# Patient Record
Sex: Female | Born: 1945 | Race: White | Hispanic: No | Smoking: Never smoker
Health system: Southern US, Community
[De-identification: ages and names within clinical notes are randomized; demographics above are authoritative.]

## PROBLEM LIST (undated history)

## (undated) DIAGNOSIS — E119 Type 2 diabetes mellitus without complications: Secondary | ICD-10-CM

## (undated) DIAGNOSIS — I2510 Atherosclerotic heart disease of native coronary artery without angina pectoris: Secondary | ICD-10-CM

## (undated) DIAGNOSIS — F419 Anxiety disorder, unspecified: Secondary | ICD-10-CM

## (undated) DIAGNOSIS — I739 Peripheral vascular disease, unspecified: Secondary | ICD-10-CM

## (undated) DIAGNOSIS — I5181 Takotsubo syndrome: Secondary | ICD-10-CM

## (undated) DIAGNOSIS — F329 Major depressive disorder, single episode, unspecified: Secondary | ICD-10-CM

## (undated) DIAGNOSIS — F32A Depression, unspecified: Secondary | ICD-10-CM

## (undated) DIAGNOSIS — I10 Essential (primary) hypertension: Secondary | ICD-10-CM

## (undated) HISTORY — DX: Essential (primary) hypertension: I10

## (undated) HISTORY — PX: CARDIAC CATHETERIZATION: SHX172

## (undated) HISTORY — DX: Major depressive disorder, single episode, unspecified: F32.9

## (undated) HISTORY — DX: Type 2 diabetes mellitus without complications: E11.9

## (undated) HISTORY — DX: Anxiety disorder, unspecified: F41.9

## (undated) HISTORY — DX: Depression, unspecified: F32.A

## (undated) HISTORY — DX: Takotsubo syndrome: I51.81

## (undated) HISTORY — PX: ABDOMINAL HYSTERECTOMY: SHX81

## (undated) HISTORY — DX: Atherosclerotic heart disease of native coronary artery without angina pectoris: I25.10

## (undated) HISTORY — PX: BREAST BIOPSY: SHX20

## (undated) HISTORY — PX: TUMOR REMOVAL: SHX12

## (undated) HISTORY — DX: Peripheral vascular disease, unspecified: I73.9

---

## 2011-09-18 DIAGNOSIS — E669 Obesity, unspecified: Secondary | ICD-10-CM | POA: Diagnosis not present

## 2011-09-18 DIAGNOSIS — F329 Major depressive disorder, single episode, unspecified: Secondary | ICD-10-CM | POA: Diagnosis not present

## 2011-09-18 DIAGNOSIS — I10 Essential (primary) hypertension: Secondary | ICD-10-CM | POA: Diagnosis not present

## 2011-09-18 DIAGNOSIS — E785 Hyperlipidemia, unspecified: Secondary | ICD-10-CM | POA: Diagnosis not present

## 2011-10-26 DIAGNOSIS — Z23 Encounter for immunization: Secondary | ICD-10-CM | POA: Diagnosis not present

## 2011-10-26 DIAGNOSIS — Z0000 Encounter for general adult medical examination without abnormal findings: Secondary | ICD-10-CM | POA: Diagnosis not present

## 2011-10-26 DIAGNOSIS — E785 Hyperlipidemia, unspecified: Secondary | ICD-10-CM | POA: Diagnosis not present

## 2011-10-26 DIAGNOSIS — E119 Type 2 diabetes mellitus without complications: Secondary | ICD-10-CM | POA: Diagnosis not present

## 2011-11-13 DIAGNOSIS — Z1231 Encounter for screening mammogram for malignant neoplasm of breast: Secondary | ICD-10-CM | POA: Diagnosis not present

## 2011-11-29 DIAGNOSIS — Z1211 Encounter for screening for malignant neoplasm of colon: Secondary | ICD-10-CM | POA: Diagnosis not present

## 2011-11-29 DIAGNOSIS — K5730 Diverticulosis of large intestine without perforation or abscess without bleeding: Secondary | ICD-10-CM | POA: Diagnosis not present

## 2012-02-27 DIAGNOSIS — I10 Essential (primary) hypertension: Secondary | ICD-10-CM | POA: Diagnosis not present

## 2012-02-27 DIAGNOSIS — E119 Type 2 diabetes mellitus without complications: Secondary | ICD-10-CM | POA: Diagnosis not present

## 2012-02-27 DIAGNOSIS — Z6834 Body mass index (BMI) 34.0-34.9, adult: Secondary | ICD-10-CM | POA: Diagnosis not present

## 2012-02-27 DIAGNOSIS — E785 Hyperlipidemia, unspecified: Secondary | ICD-10-CM | POA: Diagnosis not present

## 2012-03-01 DIAGNOSIS — Z13820 Encounter for screening for osteoporosis: Secondary | ICD-10-CM | POA: Diagnosis not present

## 2012-05-20 DIAGNOSIS — E785 Hyperlipidemia, unspecified: Secondary | ICD-10-CM | POA: Diagnosis not present

## 2012-05-20 DIAGNOSIS — Z79899 Other long term (current) drug therapy: Secondary | ICD-10-CM | POA: Diagnosis not present

## 2012-05-20 DIAGNOSIS — I10 Essential (primary) hypertension: Secondary | ICD-10-CM | POA: Diagnosis not present

## 2012-05-20 DIAGNOSIS — F329 Major depressive disorder, single episode, unspecified: Secondary | ICD-10-CM | POA: Diagnosis not present

## 2012-06-10 DIAGNOSIS — H40059 Ocular hypertension, unspecified eye: Secondary | ICD-10-CM | POA: Diagnosis not present

## 2012-07-02 DIAGNOSIS — I10 Essential (primary) hypertension: Secondary | ICD-10-CM | POA: Diagnosis not present

## 2012-07-02 DIAGNOSIS — Z6834 Body mass index (BMI) 34.0-34.9, adult: Secondary | ICD-10-CM | POA: Diagnosis not present

## 2012-07-02 DIAGNOSIS — E119 Type 2 diabetes mellitus without complications: Secondary | ICD-10-CM | POA: Diagnosis not present

## 2012-07-02 DIAGNOSIS — E785 Hyperlipidemia, unspecified: Secondary | ICD-10-CM | POA: Diagnosis not present

## 2012-10-28 DIAGNOSIS — Z6833 Body mass index (BMI) 33.0-33.9, adult: Secondary | ICD-10-CM | POA: Diagnosis not present

## 2012-10-28 DIAGNOSIS — J209 Acute bronchitis, unspecified: Secondary | ICD-10-CM | POA: Diagnosis not present

## 2012-11-06 DIAGNOSIS — Z6834 Body mass index (BMI) 34.0-34.9, adult: Secondary | ICD-10-CM | POA: Diagnosis not present

## 2012-11-06 DIAGNOSIS — E119 Type 2 diabetes mellitus without complications: Secondary | ICD-10-CM | POA: Diagnosis not present

## 2012-11-06 DIAGNOSIS — Z0000 Encounter for general adult medical examination without abnormal findings: Secondary | ICD-10-CM | POA: Diagnosis not present

## 2012-12-09 DIAGNOSIS — E785 Hyperlipidemia, unspecified: Secondary | ICD-10-CM | POA: Diagnosis not present

## 2012-12-09 DIAGNOSIS — G479 Sleep disorder, unspecified: Secondary | ICD-10-CM | POA: Diagnosis not present

## 2012-12-09 DIAGNOSIS — I10 Essential (primary) hypertension: Secondary | ICD-10-CM | POA: Diagnosis not present

## 2012-12-30 DIAGNOSIS — R011 Cardiac murmur, unspecified: Secondary | ICD-10-CM | POA: Diagnosis not present

## 2013-03-07 DIAGNOSIS — I10 Essential (primary) hypertension: Secondary | ICD-10-CM | POA: Diagnosis not present

## 2013-03-07 DIAGNOSIS — Z6834 Body mass index (BMI) 34.0-34.9, adult: Secondary | ICD-10-CM | POA: Diagnosis not present

## 2013-03-07 DIAGNOSIS — E785 Hyperlipidemia, unspecified: Secondary | ICD-10-CM | POA: Diagnosis not present

## 2013-03-07 DIAGNOSIS — R233 Spontaneous ecchymoses: Secondary | ICD-10-CM | POA: Diagnosis not present

## 2013-03-07 DIAGNOSIS — E119 Type 2 diabetes mellitus without complications: Secondary | ICD-10-CM | POA: Diagnosis not present

## 2013-03-18 DIAGNOSIS — M719 Bursopathy, unspecified: Secondary | ICD-10-CM | POA: Diagnosis not present

## 2013-03-24 DIAGNOSIS — E7800 Pure hypercholesterolemia, unspecified: Secondary | ICD-10-CM | POA: Insufficient documentation

## 2013-03-24 DIAGNOSIS — I10 Essential (primary) hypertension: Secondary | ICD-10-CM | POA: Insufficient documentation

## 2013-03-24 DIAGNOSIS — D492 Neoplasm of unspecified behavior of bone, soft tissue, and skin: Secondary | ICD-10-CM | POA: Diagnosis not present

## 2013-03-24 DIAGNOSIS — E119 Type 2 diabetes mellitus without complications: Secondary | ICD-10-CM | POA: Insufficient documentation

## 2013-03-24 HISTORY — DX: Essential (primary) hypertension: I10

## 2013-03-24 HISTORY — DX: Type 2 diabetes mellitus without complications: E11.9

## 2013-03-24 HISTORY — DX: Pure hypercholesterolemia, unspecified: E78.00

## 2013-04-09 DIAGNOSIS — M899 Disorder of bone, unspecified: Secondary | ICD-10-CM | POA: Diagnosis not present

## 2013-04-09 DIAGNOSIS — D492 Neoplasm of unspecified behavior of bone, soft tissue, and skin: Secondary | ICD-10-CM | POA: Diagnosis not present

## 2013-04-09 DIAGNOSIS — M949 Disorder of cartilage, unspecified: Secondary | ICD-10-CM | POA: Diagnosis not present

## 2013-04-17 DIAGNOSIS — I10 Essential (primary) hypertension: Secondary | ICD-10-CM | POA: Diagnosis not present

## 2013-04-17 DIAGNOSIS — E119 Type 2 diabetes mellitus without complications: Secondary | ICD-10-CM | POA: Diagnosis not present

## 2013-04-17 DIAGNOSIS — D492 Neoplasm of unspecified behavior of bone, soft tissue, and skin: Secondary | ICD-10-CM | POA: Diagnosis not present

## 2013-04-17 DIAGNOSIS — M948X9 Other specified disorders of cartilage, unspecified sites: Secondary | ICD-10-CM | POA: Diagnosis not present

## 2013-04-17 DIAGNOSIS — D1610 Benign neoplasm of short bones of unspecified upper limb: Secondary | ICD-10-CM | POA: Diagnosis not present

## 2013-04-29 DIAGNOSIS — M25519 Pain in unspecified shoulder: Secondary | ICD-10-CM | POA: Diagnosis not present

## 2013-04-29 DIAGNOSIS — M67919 Unspecified disorder of synovium and tendon, unspecified shoulder: Secondary | ICD-10-CM | POA: Diagnosis not present

## 2013-05-06 DIAGNOSIS — D492 Neoplasm of unspecified behavior of bone, soft tissue, and skin: Secondary | ICD-10-CM | POA: Diagnosis not present

## 2013-05-14 DIAGNOSIS — D492 Neoplasm of unspecified behavior of bone, soft tissue, and skin: Secondary | ICD-10-CM | POA: Diagnosis not present

## 2013-05-16 DIAGNOSIS — D492 Neoplasm of unspecified behavior of bone, soft tissue, and skin: Secondary | ICD-10-CM | POA: Diagnosis not present

## 2013-05-19 DIAGNOSIS — D492 Neoplasm of unspecified behavior of bone, soft tissue, and skin: Secondary | ICD-10-CM | POA: Diagnosis not present

## 2013-05-22 DIAGNOSIS — D492 Neoplasm of unspecified behavior of bone, soft tissue, and skin: Secondary | ICD-10-CM | POA: Diagnosis not present

## 2013-05-26 DIAGNOSIS — Z23 Encounter for immunization: Secondary | ICD-10-CM | POA: Diagnosis not present

## 2013-05-26 DIAGNOSIS — D492 Neoplasm of unspecified behavior of bone, soft tissue, and skin: Secondary | ICD-10-CM | POA: Diagnosis not present

## 2013-05-29 DIAGNOSIS — D492 Neoplasm of unspecified behavior of bone, soft tissue, and skin: Secondary | ICD-10-CM | POA: Diagnosis not present

## 2013-06-03 DIAGNOSIS — G5601 Carpal tunnel syndrome, right upper limb: Secondary | ICD-10-CM | POA: Insufficient documentation

## 2013-06-03 DIAGNOSIS — M898X9 Other specified disorders of bone, unspecified site: Secondary | ICD-10-CM

## 2013-06-03 DIAGNOSIS — M19049 Primary osteoarthritis, unspecified hand: Secondary | ICD-10-CM | POA: Diagnosis not present

## 2013-06-03 DIAGNOSIS — G5600 Carpal tunnel syndrome, unspecified upper limb: Secondary | ICD-10-CM | POA: Diagnosis not present

## 2013-06-03 DIAGNOSIS — D492 Neoplasm of unspecified behavior of bone, soft tissue, and skin: Secondary | ICD-10-CM | POA: Diagnosis not present

## 2013-06-03 DIAGNOSIS — M8560 Other cyst of bone, unspecified site: Secondary | ICD-10-CM | POA: Diagnosis not present

## 2013-06-03 HISTORY — DX: Other specified disorders of bone, unspecified site: M89.8X9

## 2013-06-03 HISTORY — DX: Carpal tunnel syndrome, right upper limb: G56.01

## 2013-06-04 DIAGNOSIS — D492 Neoplasm of unspecified behavior of bone, soft tissue, and skin: Secondary | ICD-10-CM | POA: Diagnosis not present

## 2013-06-06 DIAGNOSIS — D492 Neoplasm of unspecified behavior of bone, soft tissue, and skin: Secondary | ICD-10-CM | POA: Diagnosis not present

## 2013-06-09 DIAGNOSIS — G5600 Carpal tunnel syndrome, unspecified upper limb: Secondary | ICD-10-CM | POA: Diagnosis not present

## 2013-06-10 DIAGNOSIS — D492 Neoplasm of unspecified behavior of bone, soft tissue, and skin: Secondary | ICD-10-CM | POA: Diagnosis not present

## 2013-06-11 DIAGNOSIS — H2589 Other age-related cataract: Secondary | ICD-10-CM | POA: Diagnosis not present

## 2013-06-11 DIAGNOSIS — H524 Presbyopia: Secondary | ICD-10-CM | POA: Diagnosis not present

## 2013-06-12 DIAGNOSIS — D492 Neoplasm of unspecified behavior of bone, soft tissue, and skin: Secondary | ICD-10-CM | POA: Diagnosis not present

## 2013-06-17 DIAGNOSIS — M7989 Other specified soft tissue disorders: Secondary | ICD-10-CM | POA: Diagnosis not present

## 2013-06-17 DIAGNOSIS — D492 Neoplasm of unspecified behavior of bone, soft tissue, and skin: Secondary | ICD-10-CM | POA: Diagnosis not present

## 2013-06-17 DIAGNOSIS — M79609 Pain in unspecified limb: Secondary | ICD-10-CM | POA: Diagnosis not present

## 2013-06-19 DIAGNOSIS — D492 Neoplasm of unspecified behavior of bone, soft tissue, and skin: Secondary | ICD-10-CM | POA: Diagnosis not present

## 2013-06-24 DIAGNOSIS — M79609 Pain in unspecified limb: Secondary | ICD-10-CM | POA: Diagnosis not present

## 2013-06-24 DIAGNOSIS — D492 Neoplasm of unspecified behavior of bone, soft tissue, and skin: Secondary | ICD-10-CM | POA: Diagnosis not present

## 2013-06-24 DIAGNOSIS — M7989 Other specified soft tissue disorders: Secondary | ICD-10-CM | POA: Diagnosis not present

## 2013-06-26 DIAGNOSIS — M7989 Other specified soft tissue disorders: Secondary | ICD-10-CM | POA: Diagnosis not present

## 2013-06-26 DIAGNOSIS — M79609 Pain in unspecified limb: Secondary | ICD-10-CM | POA: Diagnosis not present

## 2013-06-26 DIAGNOSIS — R209 Unspecified disturbances of skin sensation: Secondary | ICD-10-CM | POA: Diagnosis not present

## 2013-06-26 DIAGNOSIS — D492 Neoplasm of unspecified behavior of bone, soft tissue, and skin: Secondary | ICD-10-CM | POA: Diagnosis not present

## 2013-06-30 DIAGNOSIS — G5600 Carpal tunnel syndrome, unspecified upper limb: Secondary | ICD-10-CM | POA: Diagnosis not present

## 2013-07-01 DIAGNOSIS — M7989 Other specified soft tissue disorders: Secondary | ICD-10-CM | POA: Diagnosis not present

## 2013-07-01 DIAGNOSIS — D492 Neoplasm of unspecified behavior of bone, soft tissue, and skin: Secondary | ICD-10-CM | POA: Diagnosis not present

## 2013-07-01 DIAGNOSIS — M79609 Pain in unspecified limb: Secondary | ICD-10-CM | POA: Diagnosis not present

## 2013-07-02 DIAGNOSIS — G479 Sleep disorder, unspecified: Secondary | ICD-10-CM | POA: Diagnosis not present

## 2013-07-02 DIAGNOSIS — I10 Essential (primary) hypertension: Secondary | ICD-10-CM | POA: Diagnosis not present

## 2013-07-02 DIAGNOSIS — Z79899 Other long term (current) drug therapy: Secondary | ICD-10-CM | POA: Diagnosis not present

## 2013-07-02 DIAGNOSIS — E785 Hyperlipidemia, unspecified: Secondary | ICD-10-CM | POA: Diagnosis not present

## 2013-07-03 DIAGNOSIS — M7989 Other specified soft tissue disorders: Secondary | ICD-10-CM | POA: Diagnosis not present

## 2013-07-03 DIAGNOSIS — M79609 Pain in unspecified limb: Secondary | ICD-10-CM | POA: Diagnosis not present

## 2013-07-03 DIAGNOSIS — D492 Neoplasm of unspecified behavior of bone, soft tissue, and skin: Secondary | ICD-10-CM | POA: Diagnosis not present

## 2013-07-07 DIAGNOSIS — M7989 Other specified soft tissue disorders: Secondary | ICD-10-CM | POA: Diagnosis not present

## 2013-07-07 DIAGNOSIS — D492 Neoplasm of unspecified behavior of bone, soft tissue, and skin: Secondary | ICD-10-CM | POA: Diagnosis not present

## 2013-07-07 DIAGNOSIS — M79609 Pain in unspecified limb: Secondary | ICD-10-CM | POA: Diagnosis not present

## 2013-07-08 DIAGNOSIS — IMO0001 Reserved for inherently not codable concepts without codable children: Secondary | ICD-10-CM | POA: Diagnosis not present

## 2013-07-08 DIAGNOSIS — E785 Hyperlipidemia, unspecified: Secondary | ICD-10-CM | POA: Diagnosis not present

## 2013-07-08 DIAGNOSIS — I10 Essential (primary) hypertension: Secondary | ICD-10-CM | POA: Diagnosis not present

## 2013-07-08 DIAGNOSIS — Z6834 Body mass index (BMI) 34.0-34.9, adult: Secondary | ICD-10-CM | POA: Diagnosis not present

## 2013-07-09 DIAGNOSIS — M7989 Other specified soft tissue disorders: Secondary | ICD-10-CM | POA: Diagnosis not present

## 2013-07-09 DIAGNOSIS — D492 Neoplasm of unspecified behavior of bone, soft tissue, and skin: Secondary | ICD-10-CM | POA: Diagnosis not present

## 2013-07-09 DIAGNOSIS — M79609 Pain in unspecified limb: Secondary | ICD-10-CM | POA: Diagnosis not present

## 2013-07-11 DIAGNOSIS — F411 Generalized anxiety disorder: Secondary | ICD-10-CM | POA: Diagnosis not present

## 2013-07-11 DIAGNOSIS — E7800 Pure hypercholesterolemia, unspecified: Secondary | ICD-10-CM | POA: Diagnosis not present

## 2013-07-11 DIAGNOSIS — I10 Essential (primary) hypertension: Secondary | ICD-10-CM | POA: Diagnosis not present

## 2013-07-11 DIAGNOSIS — M1990 Unspecified osteoarthritis, unspecified site: Secondary | ICD-10-CM | POA: Diagnosis not present

## 2013-07-11 DIAGNOSIS — Z7982 Long term (current) use of aspirin: Secondary | ICD-10-CM | POA: Diagnosis not present

## 2013-07-11 DIAGNOSIS — Z79899 Other long term (current) drug therapy: Secondary | ICD-10-CM | POA: Diagnosis not present

## 2013-07-11 DIAGNOSIS — G5600 Carpal tunnel syndrome, unspecified upper limb: Secondary | ICD-10-CM | POA: Diagnosis not present

## 2013-07-11 DIAGNOSIS — E119 Type 2 diabetes mellitus without complications: Secondary | ICD-10-CM | POA: Diagnosis not present

## 2013-08-05 DIAGNOSIS — Z6834 Body mass index (BMI) 34.0-34.9, adult: Secondary | ICD-10-CM | POA: Diagnosis not present

## 2013-08-05 DIAGNOSIS — I10 Essential (primary) hypertension: Secondary | ICD-10-CM | POA: Diagnosis not present

## 2013-09-02 DIAGNOSIS — M8560 Other cyst of bone, unspecified site: Secondary | ICD-10-CM | POA: Diagnosis not present

## 2013-09-02 DIAGNOSIS — M899 Disorder of bone, unspecified: Secondary | ICD-10-CM | POA: Diagnosis not present

## 2013-09-02 DIAGNOSIS — D492 Neoplasm of unspecified behavior of bone, soft tissue, and skin: Secondary | ICD-10-CM | POA: Diagnosis not present

## 2013-09-02 DIAGNOSIS — M19049 Primary osteoarthritis, unspecified hand: Secondary | ICD-10-CM | POA: Diagnosis not present

## 2013-09-30 DIAGNOSIS — IMO0001 Reserved for inherently not codable concepts without codable children: Secondary | ICD-10-CM | POA: Diagnosis not present

## 2013-09-30 DIAGNOSIS — I10 Essential (primary) hypertension: Secondary | ICD-10-CM | POA: Diagnosis not present

## 2013-09-30 DIAGNOSIS — Z6834 Body mass index (BMI) 34.0-34.9, adult: Secondary | ICD-10-CM | POA: Diagnosis not present

## 2013-11-19 DIAGNOSIS — Z9181 History of falling: Secondary | ICD-10-CM | POA: Diagnosis not present

## 2013-11-19 DIAGNOSIS — E785 Hyperlipidemia, unspecified: Secondary | ICD-10-CM | POA: Diagnosis not present

## 2013-11-19 DIAGNOSIS — Z6833 Body mass index (BMI) 33.0-33.9, adult: Secondary | ICD-10-CM | POA: Diagnosis not present

## 2013-11-19 DIAGNOSIS — I10 Essential (primary) hypertension: Secondary | ICD-10-CM | POA: Diagnosis not present

## 2013-11-19 DIAGNOSIS — Z1331 Encounter for screening for depression: Secondary | ICD-10-CM | POA: Diagnosis not present

## 2013-11-19 DIAGNOSIS — E119 Type 2 diabetes mellitus without complications: Secondary | ICD-10-CM | POA: Diagnosis not present

## 2014-01-07 DIAGNOSIS — G4710 Hypersomnia, unspecified: Secondary | ICD-10-CM | POA: Diagnosis not present

## 2014-01-07 DIAGNOSIS — E785 Hyperlipidemia, unspecified: Secondary | ICD-10-CM | POA: Diagnosis not present

## 2014-01-07 DIAGNOSIS — Z79899 Other long term (current) drug therapy: Secondary | ICD-10-CM | POA: Diagnosis not present

## 2014-01-07 DIAGNOSIS — I10 Essential (primary) hypertension: Secondary | ICD-10-CM | POA: Diagnosis not present

## 2014-02-25 DIAGNOSIS — I10 Essential (primary) hypertension: Secondary | ICD-10-CM | POA: Diagnosis not present

## 2014-02-25 DIAGNOSIS — E119 Type 2 diabetes mellitus without complications: Secondary | ICD-10-CM | POA: Diagnosis not present

## 2014-02-25 DIAGNOSIS — E785 Hyperlipidemia, unspecified: Secondary | ICD-10-CM | POA: Diagnosis not present

## 2014-02-25 DIAGNOSIS — Z6832 Body mass index (BMI) 32.0-32.9, adult: Secondary | ICD-10-CM | POA: Diagnosis not present

## 2014-06-08 DIAGNOSIS — Z6833 Body mass index (BMI) 33.0-33.9, adult: Secondary | ICD-10-CM | POA: Diagnosis not present

## 2014-06-08 DIAGNOSIS — M25519 Pain in unspecified shoulder: Secondary | ICD-10-CM | POA: Diagnosis not present

## 2014-06-12 DIAGNOSIS — Z23 Encounter for immunization: Secondary | ICD-10-CM | POA: Diagnosis not present

## 2014-06-17 DIAGNOSIS — Z1231 Encounter for screening mammogram for malignant neoplasm of breast: Secondary | ICD-10-CM | POA: Diagnosis not present

## 2014-06-17 DIAGNOSIS — E785 Hyperlipidemia, unspecified: Secondary | ICD-10-CM | POA: Diagnosis not present

## 2014-06-17 DIAGNOSIS — Z0000 Encounter for general adult medical examination without abnormal findings: Secondary | ICD-10-CM | POA: Diagnosis not present

## 2014-06-17 DIAGNOSIS — E119 Type 2 diabetes mellitus without complications: Secondary | ICD-10-CM | POA: Diagnosis not present

## 2014-06-17 DIAGNOSIS — G4700 Insomnia, unspecified: Secondary | ICD-10-CM | POA: Diagnosis not present

## 2014-06-17 DIAGNOSIS — Z6833 Body mass index (BMI) 33.0-33.9, adult: Secondary | ICD-10-CM | POA: Diagnosis not present

## 2014-06-17 DIAGNOSIS — I10 Essential (primary) hypertension: Secondary | ICD-10-CM | POA: Diagnosis not present

## 2014-06-18 DIAGNOSIS — H25813 Combined forms of age-related cataract, bilateral: Secondary | ICD-10-CM | POA: Diagnosis not present

## 2014-06-18 DIAGNOSIS — M5412 Radiculopathy, cervical region: Secondary | ICD-10-CM | POA: Diagnosis not present

## 2014-06-24 DIAGNOSIS — M5030 Other cervical disc degeneration, unspecified cervical region: Secondary | ICD-10-CM | POA: Diagnosis not present

## 2014-06-24 DIAGNOSIS — M5032 Other cervical disc degeneration, mid-cervical region: Secondary | ICD-10-CM | POA: Diagnosis not present

## 2014-06-24 DIAGNOSIS — M4802 Spinal stenosis, cervical region: Secondary | ICD-10-CM | POA: Diagnosis not present

## 2014-06-24 DIAGNOSIS — M5412 Radiculopathy, cervical region: Secondary | ICD-10-CM | POA: Diagnosis not present

## 2014-06-24 DIAGNOSIS — M5020 Other cervical disc displacement, unspecified cervical region: Secondary | ICD-10-CM | POA: Diagnosis not present

## 2014-06-26 DIAGNOSIS — M5412 Radiculopathy, cervical region: Secondary | ICD-10-CM | POA: Diagnosis not present

## 2014-07-01 DIAGNOSIS — Z1231 Encounter for screening mammogram for malignant neoplasm of breast: Secondary | ICD-10-CM | POA: Diagnosis not present

## 2014-07-06 DIAGNOSIS — R9431 Abnormal electrocardiogram [ECG] [EKG]: Secondary | ICD-10-CM | POA: Diagnosis not present

## 2014-07-06 DIAGNOSIS — Z01818 Encounter for other preprocedural examination: Secondary | ICD-10-CM | POA: Diagnosis not present

## 2014-07-06 DIAGNOSIS — R52 Pain, unspecified: Secondary | ICD-10-CM | POA: Diagnosis not present

## 2014-07-06 DIAGNOSIS — M79603 Pain in arm, unspecified: Secondary | ICD-10-CM | POA: Diagnosis not present

## 2014-07-06 DIAGNOSIS — Z79899 Other long term (current) drug therapy: Secondary | ICD-10-CM | POA: Diagnosis not present

## 2014-07-08 DIAGNOSIS — E785 Hyperlipidemia, unspecified: Secondary | ICD-10-CM | POA: Diagnosis not present

## 2014-07-08 DIAGNOSIS — M5412 Radiculopathy, cervical region: Secondary | ICD-10-CM | POA: Diagnosis not present

## 2014-07-08 DIAGNOSIS — I10 Essential (primary) hypertension: Secondary | ICD-10-CM | POA: Diagnosis not present

## 2014-07-08 DIAGNOSIS — Z01810 Encounter for preprocedural cardiovascular examination: Secondary | ICD-10-CM | POA: Diagnosis not present

## 2014-07-08 DIAGNOSIS — Z79899 Other long term (current) drug therapy: Secondary | ICD-10-CM | POA: Diagnosis not present

## 2014-07-08 DIAGNOSIS — G4710 Hypersomnia, unspecified: Secondary | ICD-10-CM | POA: Diagnosis not present

## 2014-07-16 DIAGNOSIS — E780 Pure hypercholesterolemia: Secondary | ICD-10-CM | POA: Diagnosis present

## 2014-07-16 DIAGNOSIS — M5030 Other cervical disc degeneration, unspecified cervical region: Secondary | ICD-10-CM | POA: Diagnosis not present

## 2014-07-16 DIAGNOSIS — Z7982 Long term (current) use of aspirin: Secondary | ICD-10-CM | POA: Diagnosis not present

## 2014-07-16 DIAGNOSIS — F419 Anxiety disorder, unspecified: Secondary | ICD-10-CM | POA: Diagnosis present

## 2014-07-16 DIAGNOSIS — I10 Essential (primary) hypertension: Secondary | ICD-10-CM | POA: Diagnosis not present

## 2014-07-16 DIAGNOSIS — E119 Type 2 diabetes mellitus without complications: Secondary | ICD-10-CM | POA: Diagnosis not present

## 2014-07-16 DIAGNOSIS — M5012 Cervical disc disorder with radiculopathy, mid-cervical region: Secondary | ICD-10-CM | POA: Diagnosis not present

## 2014-07-16 DIAGNOSIS — M4802 Spinal stenosis, cervical region: Secondary | ICD-10-CM | POA: Diagnosis not present

## 2014-07-16 DIAGNOSIS — R200 Anesthesia of skin: Secondary | ICD-10-CM | POA: Diagnosis not present

## 2014-07-16 DIAGNOSIS — M5412 Radiculopathy, cervical region: Secondary | ICD-10-CM | POA: Diagnosis not present

## 2014-07-16 DIAGNOSIS — Z79899 Other long term (current) drug therapy: Secondary | ICD-10-CM | POA: Diagnosis not present

## 2014-08-28 DIAGNOSIS — Z1212 Encounter for screening for malignant neoplasm of rectum: Secondary | ICD-10-CM | POA: Diagnosis not present

## 2014-09-22 DIAGNOSIS — E785 Hyperlipidemia, unspecified: Secondary | ICD-10-CM | POA: Diagnosis not present

## 2014-09-22 DIAGNOSIS — I10 Essential (primary) hypertension: Secondary | ICD-10-CM | POA: Diagnosis not present

## 2014-09-22 DIAGNOSIS — E1165 Type 2 diabetes mellitus with hyperglycemia: Secondary | ICD-10-CM | POA: Diagnosis not present

## 2014-09-22 DIAGNOSIS — Z6834 Body mass index (BMI) 34.0-34.9, adult: Secondary | ICD-10-CM | POA: Diagnosis not present

## 2014-09-22 DIAGNOSIS — F329 Major depressive disorder, single episode, unspecified: Secondary | ICD-10-CM | POA: Diagnosis not present

## 2014-09-22 DIAGNOSIS — Z23 Encounter for immunization: Secondary | ICD-10-CM | POA: Diagnosis not present

## 2014-10-14 DIAGNOSIS — Z981 Arthrodesis status: Secondary | ICD-10-CM | POA: Diagnosis not present

## 2015-01-07 DIAGNOSIS — I10 Essential (primary) hypertension: Secondary | ICD-10-CM | POA: Diagnosis not present

## 2015-01-07 DIAGNOSIS — G4700 Insomnia, unspecified: Secondary | ICD-10-CM | POA: Diagnosis not present

## 2015-01-07 DIAGNOSIS — E1165 Type 2 diabetes mellitus with hyperglycemia: Secondary | ICD-10-CM | POA: Diagnosis not present

## 2015-01-07 DIAGNOSIS — E785 Hyperlipidemia, unspecified: Secondary | ICD-10-CM | POA: Diagnosis not present

## 2015-01-07 DIAGNOSIS — F329 Major depressive disorder, single episode, unspecified: Secondary | ICD-10-CM | POA: Diagnosis not present

## 2015-01-07 DIAGNOSIS — Z6833 Body mass index (BMI) 33.0-33.9, adult: Secondary | ICD-10-CM | POA: Diagnosis not present

## 2015-01-13 DIAGNOSIS — Z981 Arthrodesis status: Secondary | ICD-10-CM | POA: Diagnosis not present

## 2015-02-18 DIAGNOSIS — I10 Essential (primary) hypertension: Secondary | ICD-10-CM | POA: Diagnosis not present

## 2015-02-18 DIAGNOSIS — E1165 Type 2 diabetes mellitus with hyperglycemia: Secondary | ICD-10-CM | POA: Diagnosis not present

## 2015-02-18 DIAGNOSIS — Z6833 Body mass index (BMI) 33.0-33.9, adult: Secondary | ICD-10-CM | POA: Diagnosis not present

## 2015-03-10 DIAGNOSIS — E780 Pure hypercholesterolemia: Secondary | ICD-10-CM | POA: Diagnosis not present

## 2015-03-10 DIAGNOSIS — I10 Essential (primary) hypertension: Secondary | ICD-10-CM | POA: Diagnosis not present

## 2015-04-02 DIAGNOSIS — Z6835 Body mass index (BMI) 35.0-35.9, adult: Secondary | ICD-10-CM | POA: Diagnosis not present

## 2015-04-02 DIAGNOSIS — E785 Hyperlipidemia, unspecified: Secondary | ICD-10-CM | POA: Diagnosis not present

## 2015-04-02 DIAGNOSIS — I10 Essential (primary) hypertension: Secondary | ICD-10-CM | POA: Diagnosis not present

## 2015-04-02 DIAGNOSIS — E1165 Type 2 diabetes mellitus with hyperglycemia: Secondary | ICD-10-CM | POA: Diagnosis not present

## 2015-04-02 DIAGNOSIS — R635 Abnormal weight gain: Secondary | ICD-10-CM | POA: Diagnosis not present

## 2015-04-02 DIAGNOSIS — G4700 Insomnia, unspecified: Secondary | ICD-10-CM | POA: Diagnosis not present

## 2015-04-14 DIAGNOSIS — Z6833 Body mass index (BMI) 33.0-33.9, adult: Secondary | ICD-10-CM | POA: Diagnosis not present

## 2015-04-14 DIAGNOSIS — E1165 Type 2 diabetes mellitus with hyperglycemia: Secondary | ICD-10-CM | POA: Diagnosis not present

## 2015-05-05 DIAGNOSIS — G4700 Insomnia, unspecified: Secondary | ICD-10-CM | POA: Diagnosis not present

## 2015-05-05 DIAGNOSIS — R635 Abnormal weight gain: Secondary | ICD-10-CM | POA: Diagnosis not present

## 2015-05-05 DIAGNOSIS — E1165 Type 2 diabetes mellitus with hyperglycemia: Secondary | ICD-10-CM | POA: Diagnosis not present

## 2015-05-05 DIAGNOSIS — Z6834 Body mass index (BMI) 34.0-34.9, adult: Secondary | ICD-10-CM | POA: Diagnosis not present

## 2015-05-18 DIAGNOSIS — Z6834 Body mass index (BMI) 34.0-34.9, adult: Secondary | ICD-10-CM | POA: Diagnosis not present

## 2015-05-18 DIAGNOSIS — N390 Urinary tract infection, site not specified: Secondary | ICD-10-CM | POA: Diagnosis not present

## 2015-05-19 DIAGNOSIS — N390 Urinary tract infection, site not specified: Secondary | ICD-10-CM | POA: Diagnosis not present

## 2015-06-03 DIAGNOSIS — I10 Essential (primary) hypertension: Secondary | ICD-10-CM | POA: Diagnosis not present

## 2015-06-03 DIAGNOSIS — E780 Pure hypercholesterolemia: Secondary | ICD-10-CM | POA: Diagnosis not present

## 2015-06-09 DIAGNOSIS — E1165 Type 2 diabetes mellitus with hyperglycemia: Secondary | ICD-10-CM | POA: Diagnosis not present

## 2015-06-09 DIAGNOSIS — Z6833 Body mass index (BMI) 33.0-33.9, adult: Secondary | ICD-10-CM | POA: Diagnosis not present

## 2015-06-16 DIAGNOSIS — Z981 Arthrodesis status: Secondary | ICD-10-CM | POA: Diagnosis not present

## 2015-06-23 DIAGNOSIS — H524 Presbyopia: Secondary | ICD-10-CM | POA: Diagnosis not present

## 2015-06-23 DIAGNOSIS — H25813 Combined forms of age-related cataract, bilateral: Secondary | ICD-10-CM | POA: Diagnosis not present

## 2015-06-30 DIAGNOSIS — E7800 Pure hypercholesterolemia, unspecified: Secondary | ICD-10-CM | POA: Diagnosis not present

## 2015-06-30 DIAGNOSIS — I10 Essential (primary) hypertension: Secondary | ICD-10-CM | POA: Diagnosis not present

## 2015-07-05 DIAGNOSIS — Z23 Encounter for immunization: Secondary | ICD-10-CM | POA: Diagnosis not present

## 2015-07-07 DIAGNOSIS — Z1231 Encounter for screening mammogram for malignant neoplasm of breast: Secondary | ICD-10-CM | POA: Diagnosis not present

## 2015-07-08 DIAGNOSIS — Z6834 Body mass index (BMI) 34.0-34.9, adult: Secondary | ICD-10-CM | POA: Diagnosis not present

## 2015-07-08 DIAGNOSIS — G4700 Insomnia, unspecified: Secondary | ICD-10-CM | POA: Diagnosis not present

## 2015-07-08 DIAGNOSIS — I10 Essential (primary) hypertension: Secondary | ICD-10-CM | POA: Diagnosis not present

## 2015-07-08 DIAGNOSIS — E119 Type 2 diabetes mellitus without complications: Secondary | ICD-10-CM | POA: Diagnosis not present

## 2015-07-08 DIAGNOSIS — E785 Hyperlipidemia, unspecified: Secondary | ICD-10-CM | POA: Diagnosis not present

## 2015-07-21 DIAGNOSIS — I10 Essential (primary) hypertension: Secondary | ICD-10-CM | POA: Diagnosis not present

## 2015-07-21 DIAGNOSIS — E785 Hyperlipidemia, unspecified: Secondary | ICD-10-CM | POA: Diagnosis not present

## 2015-07-28 DIAGNOSIS — Z6833 Body mass index (BMI) 33.0-33.9, adult: Secondary | ICD-10-CM | POA: Diagnosis not present

## 2015-07-28 DIAGNOSIS — E1165 Type 2 diabetes mellitus with hyperglycemia: Secondary | ICD-10-CM | POA: Diagnosis not present

## 2015-08-04 DIAGNOSIS — E7800 Pure hypercholesterolemia, unspecified: Secondary | ICD-10-CM | POA: Diagnosis not present

## 2015-08-04 DIAGNOSIS — I10 Essential (primary) hypertension: Secondary | ICD-10-CM | POA: Diagnosis not present

## 2015-08-18 DIAGNOSIS — G4700 Insomnia, unspecified: Secondary | ICD-10-CM | POA: Diagnosis not present

## 2015-08-18 DIAGNOSIS — Z6833 Body mass index (BMI) 33.0-33.9, adult: Secondary | ICD-10-CM | POA: Diagnosis not present

## 2015-08-18 DIAGNOSIS — E669 Obesity, unspecified: Secondary | ICD-10-CM | POA: Diagnosis not present

## 2015-08-18 DIAGNOSIS — I10 Essential (primary) hypertension: Secondary | ICD-10-CM | POA: Diagnosis not present

## 2015-08-18 DIAGNOSIS — E119 Type 2 diabetes mellitus without complications: Secondary | ICD-10-CM | POA: Diagnosis not present

## 2015-08-25 DIAGNOSIS — I159 Secondary hypertension, unspecified: Secondary | ICD-10-CM | POA: Diagnosis not present

## 2015-08-25 DIAGNOSIS — I10 Essential (primary) hypertension: Secondary | ICD-10-CM | POA: Diagnosis not present

## 2015-08-25 DIAGNOSIS — E7800 Pure hypercholesterolemia, unspecified: Secondary | ICD-10-CM | POA: Diagnosis not present

## 2015-10-14 DIAGNOSIS — Z6833 Body mass index (BMI) 33.0-33.9, adult: Secondary | ICD-10-CM | POA: Diagnosis not present

## 2015-10-14 DIAGNOSIS — E119 Type 2 diabetes mellitus without complications: Secondary | ICD-10-CM | POA: Diagnosis not present

## 2015-10-14 DIAGNOSIS — G4700 Insomnia, unspecified: Secondary | ICD-10-CM | POA: Diagnosis not present

## 2015-10-14 DIAGNOSIS — I10 Essential (primary) hypertension: Secondary | ICD-10-CM | POA: Diagnosis not present

## 2015-10-14 DIAGNOSIS — E785 Hyperlipidemia, unspecified: Secondary | ICD-10-CM | POA: Diagnosis not present

## 2015-10-20 DIAGNOSIS — E7800 Pure hypercholesterolemia, unspecified: Secondary | ICD-10-CM | POA: Diagnosis not present

## 2015-10-20 DIAGNOSIS — I10 Essential (primary) hypertension: Secondary | ICD-10-CM | POA: Diagnosis not present

## 2016-01-12 DIAGNOSIS — E785 Hyperlipidemia, unspecified: Secondary | ICD-10-CM | POA: Diagnosis not present

## 2016-01-12 DIAGNOSIS — Z6832 Body mass index (BMI) 32.0-32.9, adult: Secondary | ICD-10-CM | POA: Diagnosis not present

## 2016-01-12 DIAGNOSIS — I10 Essential (primary) hypertension: Secondary | ICD-10-CM | POA: Diagnosis not present

## 2016-01-12 DIAGNOSIS — E119 Type 2 diabetes mellitus without complications: Secondary | ICD-10-CM | POA: Diagnosis not present

## 2016-01-12 DIAGNOSIS — Z1389 Encounter for screening for other disorder: Secondary | ICD-10-CM | POA: Diagnosis not present

## 2016-01-12 DIAGNOSIS — F329 Major depressive disorder, single episode, unspecified: Secondary | ICD-10-CM | POA: Diagnosis not present

## 2016-01-12 DIAGNOSIS — G4700 Insomnia, unspecified: Secondary | ICD-10-CM | POA: Diagnosis not present

## 2016-01-26 DIAGNOSIS — E7800 Pure hypercholesterolemia, unspecified: Secondary | ICD-10-CM | POA: Diagnosis not present

## 2016-01-26 DIAGNOSIS — I10 Essential (primary) hypertension: Secondary | ICD-10-CM | POA: Diagnosis not present

## 2016-04-20 DIAGNOSIS — E669 Obesity, unspecified: Secondary | ICD-10-CM | POA: Diagnosis not present

## 2016-04-20 DIAGNOSIS — Z6831 Body mass index (BMI) 31.0-31.9, adult: Secondary | ICD-10-CM | POA: Diagnosis not present

## 2016-04-20 DIAGNOSIS — J019 Acute sinusitis, unspecified: Secondary | ICD-10-CM | POA: Diagnosis not present

## 2016-05-10 ENCOUNTER — Other Ambulatory Visit: Payer: Self-pay

## 2016-05-17 DIAGNOSIS — E669 Obesity, unspecified: Secondary | ICD-10-CM | POA: Diagnosis not present

## 2016-05-17 DIAGNOSIS — E785 Hyperlipidemia, unspecified: Secondary | ICD-10-CM | POA: Diagnosis not present

## 2016-05-17 DIAGNOSIS — E119 Type 2 diabetes mellitus without complications: Secondary | ICD-10-CM | POA: Diagnosis not present

## 2016-05-17 DIAGNOSIS — Z9181 History of falling: Secondary | ICD-10-CM | POA: Diagnosis not present

## 2016-05-17 DIAGNOSIS — L508 Other urticaria: Secondary | ICD-10-CM | POA: Diagnosis not present

## 2016-05-17 DIAGNOSIS — F329 Major depressive disorder, single episode, unspecified: Secondary | ICD-10-CM | POA: Diagnosis not present

## 2016-05-17 DIAGNOSIS — I10 Essential (primary) hypertension: Secondary | ICD-10-CM | POA: Diagnosis not present

## 2016-06-28 DIAGNOSIS — H25813 Combined forms of age-related cataract, bilateral: Secondary | ICD-10-CM | POA: Diagnosis not present

## 2016-06-28 DIAGNOSIS — E119 Type 2 diabetes mellitus without complications: Secondary | ICD-10-CM | POA: Diagnosis not present

## 2016-07-10 DIAGNOSIS — Z1231 Encounter for screening mammogram for malignant neoplasm of breast: Secondary | ICD-10-CM | POA: Diagnosis not present

## 2016-08-16 DIAGNOSIS — E7800 Pure hypercholesterolemia, unspecified: Secondary | ICD-10-CM | POA: Diagnosis not present

## 2016-08-16 DIAGNOSIS — I10 Essential (primary) hypertension: Secondary | ICD-10-CM | POA: Diagnosis not present

## 2016-09-18 DIAGNOSIS — E785 Hyperlipidemia, unspecified: Secondary | ICD-10-CM | POA: Diagnosis not present

## 2016-09-18 DIAGNOSIS — I10 Essential (primary) hypertension: Secondary | ICD-10-CM | POA: Diagnosis not present

## 2016-09-18 DIAGNOSIS — E669 Obesity, unspecified: Secondary | ICD-10-CM | POA: Diagnosis not present

## 2016-09-18 DIAGNOSIS — G4700 Insomnia, unspecified: Secondary | ICD-10-CM | POA: Diagnosis not present

## 2016-09-18 DIAGNOSIS — E119 Type 2 diabetes mellitus without complications: Secondary | ICD-10-CM | POA: Diagnosis not present

## 2017-01-17 DIAGNOSIS — I10 Essential (primary) hypertension: Secondary | ICD-10-CM | POA: Diagnosis not present

## 2017-01-17 DIAGNOSIS — Z6834 Body mass index (BMI) 34.0-34.9, adult: Secondary | ICD-10-CM | POA: Diagnosis not present

## 2017-01-17 DIAGNOSIS — Z1389 Encounter for screening for other disorder: Secondary | ICD-10-CM | POA: Diagnosis not present

## 2017-01-17 DIAGNOSIS — E669 Obesity, unspecified: Secondary | ICD-10-CM | POA: Diagnosis not present

## 2017-01-17 DIAGNOSIS — G4700 Insomnia, unspecified: Secondary | ICD-10-CM | POA: Diagnosis not present

## 2017-01-17 DIAGNOSIS — E119 Type 2 diabetes mellitus without complications: Secondary | ICD-10-CM | POA: Diagnosis not present

## 2017-01-17 DIAGNOSIS — F329 Major depressive disorder, single episode, unspecified: Secondary | ICD-10-CM | POA: Diagnosis not present

## 2017-01-17 DIAGNOSIS — E785 Hyperlipidemia, unspecified: Secondary | ICD-10-CM | POA: Diagnosis not present

## 2017-02-23 DIAGNOSIS — I2129 ST elevation (STEMI) myocardial infarction involving other sites: Secondary | ICD-10-CM | POA: Diagnosis not present

## 2017-02-23 DIAGNOSIS — I10 Essential (primary) hypertension: Secondary | ICD-10-CM | POA: Diagnosis not present

## 2017-02-23 DIAGNOSIS — E119 Type 2 diabetes mellitus without complications: Secondary | ICD-10-CM | POA: Diagnosis not present

## 2017-02-23 DIAGNOSIS — I2510 Atherosclerotic heart disease of native coronary artery without angina pectoris: Secondary | ICD-10-CM | POA: Diagnosis not present

## 2017-02-23 DIAGNOSIS — I119 Hypertensive heart disease without heart failure: Secondary | ICD-10-CM | POA: Diagnosis not present

## 2017-02-23 DIAGNOSIS — I161 Hypertensive emergency: Secondary | ICD-10-CM | POA: Diagnosis not present

## 2017-02-23 DIAGNOSIS — E784 Other hyperlipidemia: Secondary | ICD-10-CM | POA: Diagnosis not present

## 2017-02-23 DIAGNOSIS — I213 ST elevation (STEMI) myocardial infarction of unspecified site: Secondary | ICD-10-CM | POA: Diagnosis not present

## 2017-02-23 DIAGNOSIS — R0789 Other chest pain: Secondary | ICD-10-CM | POA: Diagnosis not present

## 2017-02-23 DIAGNOSIS — R079 Chest pain, unspecified: Secondary | ICD-10-CM | POA: Diagnosis not present

## 2017-02-24 DIAGNOSIS — R0789 Other chest pain: Secondary | ICD-10-CM | POA: Diagnosis not present

## 2017-02-24 DIAGNOSIS — I5181 Takotsubo syndrome: Secondary | ICD-10-CM | POA: Diagnosis not present

## 2017-02-24 DIAGNOSIS — I080 Rheumatic disorders of both mitral and aortic valves: Secondary | ICD-10-CM | POA: Diagnosis not present

## 2017-02-24 DIAGNOSIS — R079 Chest pain, unspecified: Secondary | ICD-10-CM | POA: Diagnosis not present

## 2017-02-24 DIAGNOSIS — E119 Type 2 diabetes mellitus without complications: Secondary | ICD-10-CM | POA: Diagnosis not present

## 2017-02-24 DIAGNOSIS — I2510 Atherosclerotic heart disease of native coronary artery without angina pectoris: Secondary | ICD-10-CM | POA: Diagnosis not present

## 2017-02-24 DIAGNOSIS — E785 Hyperlipidemia, unspecified: Secondary | ICD-10-CM | POA: Diagnosis present

## 2017-02-24 DIAGNOSIS — I119 Hypertensive heart disease without heart failure: Secondary | ICD-10-CM | POA: Diagnosis not present

## 2017-02-24 DIAGNOSIS — F419 Anxiety disorder, unspecified: Secondary | ICD-10-CM | POA: Diagnosis present

## 2017-02-24 DIAGNOSIS — I501 Left ventricular failure: Secondary | ICD-10-CM | POA: Diagnosis not present

## 2017-02-24 DIAGNOSIS — I10 Essential (primary) hypertension: Secondary | ICD-10-CM | POA: Diagnosis present

## 2017-02-24 DIAGNOSIS — Z7984 Long term (current) use of oral hypoglycemic drugs: Secondary | ICD-10-CM | POA: Diagnosis not present

## 2017-02-24 DIAGNOSIS — E784 Other hyperlipidemia: Secondary | ICD-10-CM | POA: Diagnosis not present

## 2017-02-26 DIAGNOSIS — I5181 Takotsubo syndrome: Secondary | ICD-10-CM

## 2017-02-26 HISTORY — DX: Takotsubo syndrome: I51.81

## 2017-03-01 DIAGNOSIS — Z136 Encounter for screening for cardiovascular disorders: Secondary | ICD-10-CM | POA: Diagnosis not present

## 2017-03-01 DIAGNOSIS — Z6832 Body mass index (BMI) 32.0-32.9, adult: Secondary | ICD-10-CM | POA: Diagnosis not present

## 2017-03-01 DIAGNOSIS — Z1389 Encounter for screening for other disorder: Secondary | ICD-10-CM | POA: Diagnosis not present

## 2017-03-01 DIAGNOSIS — E785 Hyperlipidemia, unspecified: Secondary | ICD-10-CM | POA: Diagnosis not present

## 2017-03-01 DIAGNOSIS — N959 Unspecified menopausal and perimenopausal disorder: Secondary | ICD-10-CM | POA: Diagnosis not present

## 2017-03-01 DIAGNOSIS — Z9181 History of falling: Secondary | ICD-10-CM | POA: Diagnosis not present

## 2017-03-01 DIAGNOSIS — Z0000 Encounter for general adult medical examination without abnormal findings: Secondary | ICD-10-CM | POA: Diagnosis not present

## 2017-03-19 DIAGNOSIS — E119 Type 2 diabetes mellitus without complications: Secondary | ICD-10-CM | POA: Diagnosis not present

## 2017-03-19 DIAGNOSIS — R0602 Shortness of breath: Secondary | ICD-10-CM | POA: Diagnosis not present

## 2017-03-19 DIAGNOSIS — I5181 Takotsubo syndrome: Secondary | ICD-10-CM | POA: Diagnosis not present

## 2017-03-19 DIAGNOSIS — I429 Cardiomyopathy, unspecified: Secondary | ICD-10-CM | POA: Diagnosis not present

## 2017-03-19 DIAGNOSIS — I2510 Atherosclerotic heart disease of native coronary artery without angina pectoris: Secondary | ICD-10-CM | POA: Insufficient documentation

## 2017-03-19 DIAGNOSIS — E7800 Pure hypercholesterolemia, unspecified: Secondary | ICD-10-CM | POA: Diagnosis not present

## 2017-03-19 DIAGNOSIS — I10 Essential (primary) hypertension: Secondary | ICD-10-CM | POA: Diagnosis not present

## 2017-03-19 HISTORY — DX: Atherosclerotic heart disease of native coronary artery without angina pectoris: I25.10

## 2017-05-23 DIAGNOSIS — E785 Hyperlipidemia, unspecified: Secondary | ICD-10-CM | POA: Diagnosis not present

## 2017-05-23 DIAGNOSIS — I10 Essential (primary) hypertension: Secondary | ICD-10-CM | POA: Diagnosis not present

## 2017-05-23 DIAGNOSIS — Z9181 History of falling: Secondary | ICD-10-CM | POA: Diagnosis not present

## 2017-05-23 DIAGNOSIS — G4700 Insomnia, unspecified: Secondary | ICD-10-CM | POA: Diagnosis not present

## 2017-05-23 DIAGNOSIS — E119 Type 2 diabetes mellitus without complications: Secondary | ICD-10-CM | POA: Diagnosis not present

## 2017-05-23 DIAGNOSIS — E669 Obesity, unspecified: Secondary | ICD-10-CM | POA: Diagnosis not present

## 2017-06-22 ENCOUNTER — Other Ambulatory Visit: Payer: Self-pay

## 2017-07-04 DIAGNOSIS — E119 Type 2 diabetes mellitus without complications: Secondary | ICD-10-CM | POA: Diagnosis not present

## 2017-07-04 DIAGNOSIS — H25013 Cortical age-related cataract, bilateral: Secondary | ICD-10-CM | POA: Diagnosis not present

## 2017-09-26 DIAGNOSIS — I10 Essential (primary) hypertension: Secondary | ICD-10-CM | POA: Diagnosis not present

## 2017-09-26 DIAGNOSIS — F329 Major depressive disorder, single episode, unspecified: Secondary | ICD-10-CM | POA: Diagnosis not present

## 2017-09-26 DIAGNOSIS — Z2821 Immunization not carried out because of patient refusal: Secondary | ICD-10-CM | POA: Diagnosis not present

## 2017-09-26 DIAGNOSIS — E785 Hyperlipidemia, unspecified: Secondary | ICD-10-CM | POA: Diagnosis not present

## 2017-09-26 DIAGNOSIS — E119 Type 2 diabetes mellitus without complications: Secondary | ICD-10-CM | POA: Diagnosis not present

## 2017-10-17 DIAGNOSIS — I10 Essential (primary) hypertension: Secondary | ICD-10-CM | POA: Diagnosis not present

## 2017-10-17 DIAGNOSIS — I5181 Takotsubo syndrome: Secondary | ICD-10-CM | POA: Diagnosis not present

## 2017-10-17 DIAGNOSIS — E7800 Pure hypercholesterolemia, unspecified: Secondary | ICD-10-CM | POA: Diagnosis not present

## 2017-11-25 DIAGNOSIS — R51 Headache: Secondary | ICD-10-CM | POA: Diagnosis not present

## 2017-11-25 DIAGNOSIS — I160 Hypertensive urgency: Secondary | ICD-10-CM | POA: Diagnosis not present

## 2017-12-05 DIAGNOSIS — I051 Rheumatic mitral insufficiency: Secondary | ICD-10-CM | POA: Diagnosis not present

## 2017-12-05 DIAGNOSIS — I5181 Takotsubo syndrome: Secondary | ICD-10-CM | POA: Diagnosis not present

## 2018-01-24 ENCOUNTER — Encounter: Payer: Self-pay | Admitting: Cardiology

## 2018-01-24 DIAGNOSIS — I10 Essential (primary) hypertension: Secondary | ICD-10-CM | POA: Diagnosis not present

## 2018-01-24 DIAGNOSIS — E119 Type 2 diabetes mellitus without complications: Secondary | ICD-10-CM | POA: Diagnosis not present

## 2018-01-24 DIAGNOSIS — E785 Hyperlipidemia, unspecified: Secondary | ICD-10-CM | POA: Diagnosis not present

## 2018-02-06 DIAGNOSIS — Z1231 Encounter for screening mammogram for malignant neoplasm of breast: Secondary | ICD-10-CM | POA: Diagnosis not present

## 2018-03-11 ENCOUNTER — Encounter: Payer: Self-pay | Admitting: Cardiology

## 2018-03-11 ENCOUNTER — Ambulatory Visit (INDEPENDENT_AMBULATORY_CARE_PROVIDER_SITE_OTHER): Payer: Self-pay | Admitting: Cardiology

## 2018-03-11 VITALS — BP 128/66 | HR 62 | Ht 62.0 in | Wt 186.0 lb

## 2018-03-11 DIAGNOSIS — E7800 Pure hypercholesterolemia, unspecified: Secondary | ICD-10-CM

## 2018-03-11 DIAGNOSIS — I2510 Atherosclerotic heart disease of native coronary artery without angina pectoris: Secondary | ICD-10-CM

## 2018-03-11 DIAGNOSIS — E119 Type 2 diabetes mellitus without complications: Secondary | ICD-10-CM

## 2018-03-11 DIAGNOSIS — I5181 Takotsubo syndrome: Secondary | ICD-10-CM

## 2018-03-11 DIAGNOSIS — I10 Essential (primary) hypertension: Secondary | ICD-10-CM

## 2018-03-11 NOTE — Progress Notes (Signed)
Cardiology Office Note:    Date:  03/11/2018   ID:  Hayley Simon, DOB 1945-10-20, MRN [id number]  PCP:  Lowella Dandy, NP  Cardiologist:  Jenne Campus, MD    Referring MD: Lowella Dandy, NP   No chief complaint on file. I am doing well  History of Present Illness:    Hayley Simon is a 72 y.o. female with history of non-STEMI about a year ago she ended up going to [facility] cardiac catheterization was done she shot nonobstructive lesions.  She was told that she had TakaTsubo.  She completely recovered.  Echocardiogram repeated in March showed preserved left ventricular ejection fraction without segmental wall motion abnormalities.  Doing well no chest pain tightness squeezing pressure burning chest.  Past Medical History:  Diagnosis Date  . Anxiety and depression   . Coronary artery disease   . Diabetes mellitus without complication ([place])   . Hypertension   . PVD (peripheral vascular disease) ([place])   . Stress-induced cardiomyopathy       Current Medications: No outpatient medications have been marked as taking for the 03/11/18 encounter (Appointment) with Park Liter, MD.     Allergies:   Penicillins   Social History   Socioeconomic History  . Marital status: Married    Spouse name: Not on file  . Number of children: Not on file  . Years of education: Not on file  . Highest education level: Not on file  Occupational History  . Not on file  Social Needs  . Financial resource strain: Not on file  . Food insecurity:    Worry: Not on file    Inability: Not on file  . Transportation needs:    Medical: Not on file    Non-medical: Not on file  Tobacco Use  . Smoking status: Never Smoker  . Smokeless tobacco: Never Used  Substance and Sexual Activity  . Alcohol use: Not Currently  . Drug use: Never  . Sexual activity: Not on file  Lifestyle  . Physical activity:    Days per week: Not on file    Minutes per session: Not on file  .  Stress: Not on file  Relationships  . Social connections:    Talks on phone: Not on file    Gets together: Not on file    Attends religious service: Not on file    Active member of club or organization: Not on file    Attends meetings of clubs or organizations: Not on file    Relationship status: Not on file  Other Topics Concern  . Not on file  Social History Narrative  . Not on file     Family History: The patient's family history includes Healthy in her father and mother. ROS:   Please see the history of present illness.    All 14 point review of systems negative except as described per history of present illness  EKGs/Labs/Other Studies Reviewed:     Echocardiogram: March 2019  Summary  Mild mitral annular calcification.  Mild mitral regurgitation.  Ejection fraction is visually estimated at 60-65%  Normal left ventricular size and systolic function with no appreciable  segmental abnormality.  Mildly dilated right ventricle.  No evidence of previous cardiomyopathy  Recent Labs: No results found for requested labs within last 8760 hours.  Recent Lipid Panel No results found for: CHOL, TRIG, HDL, CHOLHDL, VLDL, LDLCALC, LDLDIRECT  Physical Exam:    VS:  There were no vitals taken  for this visit.    Wt Readings from Last 3 Encounters:  No data found for Wt     GEN:  Well nourished, well developed in no acute distress HEENT: Normal NECK: No JVD; No carotid bruits LYMPHATICS: No lymphadenopathy CARDIAC: RRR, no murmurs, no rubs, no gallops RESPIRATORY:  Clear to auscultation without rales, wheezing or rhonchi  ABDOMEN: Soft, non-tender, non-distended MUSCULOSKELETAL:  No edema; No deformity  SKIN: Warm and dry LOWER EXTREMITIES: no swelling NEUROLOGIC:  Alert and oriented x 3 PSYCHIATRIC:  Normal affect   ASSESSMENT:    1. Takotsubo syndrome   2. Type 2 diabetes mellitus without complication, without long-term current use of insulin (HCC)   3.  Pure hypercholesterolemia   4. Coronary artery disease involving native coronary artery of native heart without angina pectoris   5. Essential hypertension    PLAN:    In order of problems listed above:  1. Takotsubo syndrome.  Recover completely and appropriate medications which I will continue 2. Type 2 diabetes followed by internal medicine team doing well 3. Dyslipidemia on appropriate medications which I will continue 4. Coronary artery disease nonobstructive by cardiac catheterization from last year 5. Essential hypertension well controlled continue present management.   Medication Adjustments/Labs and Tests Ordered: Current medicines are reviewed at length with the patient today.  Concerns regarding medicines are outlined above.  No orders of the defined types were placed in this encounter.  Medication changes: No orders of the defined types were placed in this encounter.   Signed, Park Liter, MD, [facility] 03/11/2018 10:27 AM    [place]

## 2018-03-11 NOTE — Patient Instructions (Signed)
Medication Instructions:  Your physician recommends that you continue on your current medications as directed. Please refer to the Current Medication list given to you today.  Labwork: None  Testing/Procedures: None  Follow-Up: Your physician recommends that you schedule a follow-up appointment in: 4 months  Any Other Special Instructions Will Be Listed Below (If Applicable).     If you need a refill on your cardiac medications before your next appointment, please call your pharmacy.   [facility]  Ashley A, RN, BSN  

## 2018-05-30 DIAGNOSIS — I10 Essential (primary) hypertension: Secondary | ICD-10-CM | POA: Diagnosis not present

## 2018-05-30 DIAGNOSIS — Z1339 Encounter for screening examination for other mental health and behavioral disorders: Secondary | ICD-10-CM | POA: Diagnosis not present

## 2018-05-30 DIAGNOSIS — F329 Major depressive disorder, single episode, unspecified: Secondary | ICD-10-CM | POA: Diagnosis not present

## 2018-05-30 DIAGNOSIS — E785 Hyperlipidemia, unspecified: Secondary | ICD-10-CM | POA: Diagnosis not present

## 2018-05-30 DIAGNOSIS — E119 Type 2 diabetes mellitus without complications: Secondary | ICD-10-CM | POA: Diagnosis not present

## 2018-06-03 ENCOUNTER — Encounter (HOSPITAL_BASED_OUTPATIENT_CLINIC_OR_DEPARTMENT_OTHER): Payer: Self-pay | Admitting: *Deleted

## 2018-06-03 ENCOUNTER — Ambulatory Visit (INDEPENDENT_AMBULATORY_CARE_PROVIDER_SITE_OTHER): Payer: Medicare Other | Admitting: Cardiology

## 2018-06-03 ENCOUNTER — Other Ambulatory Visit: Payer: Self-pay

## 2018-06-03 ENCOUNTER — Emergency Department (HOSPITAL_BASED_OUTPATIENT_CLINIC_OR_DEPARTMENT_OTHER): Payer: Medicare Other

## 2018-06-03 ENCOUNTER — Encounter: Payer: Self-pay | Admitting: Cardiology

## 2018-06-03 ENCOUNTER — Emergency Department (HOSPITAL_BASED_OUTPATIENT_CLINIC_OR_DEPARTMENT_OTHER)
Admission: EM | Admit: 2018-06-03 | Discharge: 2018-06-03 | Disposition: A | Discharge status: Home / Self Care | Payer: Medicare Other | Attending: Emergency Medicine | Admitting: Emergency Medicine

## 2018-06-03 VITALS — BP 220/90 | HR 65 | Ht 62.0 in | Wt 183.0 lb

## 2018-06-03 DIAGNOSIS — I10 Essential (primary) hypertension: Secondary | ICD-10-CM

## 2018-06-03 DIAGNOSIS — E119 Type 2 diabetes mellitus without complications: Secondary | ICD-10-CM

## 2018-06-03 DIAGNOSIS — Z79899 Other long term (current) drug therapy: Secondary | ICD-10-CM | POA: Diagnosis not present

## 2018-06-03 DIAGNOSIS — I5181 Takotsubo syndrome: Secondary | ICD-10-CM

## 2018-06-03 DIAGNOSIS — Z7982 Long term (current) use of aspirin: Secondary | ICD-10-CM | POA: Insufficient documentation

## 2018-06-03 DIAGNOSIS — R079 Chest pain, unspecified: Secondary | ICD-10-CM | POA: Diagnosis present

## 2018-06-03 DIAGNOSIS — I2510 Atherosclerotic heart disease of native coronary artery without angina pectoris: Secondary | ICD-10-CM | POA: Diagnosis not present

## 2018-06-03 DIAGNOSIS — R0789 Other chest pain: Secondary | ICD-10-CM

## 2018-06-03 DIAGNOSIS — E7800 Pure hypercholesterolemia, unspecified: Secondary | ICD-10-CM

## 2018-06-03 DIAGNOSIS — Z7984 Long term (current) use of oral hypoglycemic drugs: Secondary | ICD-10-CM | POA: Diagnosis not present

## 2018-06-03 DIAGNOSIS — E1151 Type 2 diabetes mellitus with diabetic peripheral angiopathy without gangrene: Secondary | ICD-10-CM | POA: Insufficient documentation

## 2018-06-03 LAB — BASIC METABOLIC PANEL
Anion gap: 12 (ref 5–15)
BUN: 17 mg/dL (ref 8–23)
CHLORIDE: 102 mmol/L (ref 98–111)
CO2: 27 mmol/L (ref 22–32)
CREATININE: 1.01 mg/dL — AB (ref 0.44–1.00)
Calcium: 9.9 mg/dL (ref 8.9–10.3)
GFR calc Af Amer: >60 mL/min (ref >60)
GFR calc non Af Amer: 54 mL/min — ABNORMAL LOW (ref >60)
GLUCOSE: 77 mg/dL (ref 70–99)
POTASSIUM: 3.6 mmol/L (ref 3.5–5.1)
Sodium: 141 mmol/L (ref 135–145)

## 2018-06-03 LAB — CBC
HEMATOCRIT: 38.5 % (ref 36.0–46.0)
Hemoglobin: 13.4 g/dL (ref 12.0–15.0)
MCH: 33.7 pg (ref 26.0–34.0)
MCHC: 34.8 g/dL (ref 30.0–36.0)
MCV: 96.7 fL (ref 78.0–100.0)
PLATELETS: 259 10*3/uL (ref 150–400)
RBC: 3.98 MIL/uL (ref 3.87–5.11)
RDW: 12.5 % (ref 11.5–15.5)
WBC: 8 10*3/uL (ref 4.0–10.5)

## 2018-06-03 LAB — TROPONIN I: Troponin I: <0.03 ng/mL (ref <0.03)

## 2018-06-03 MED ORDER — CLONIDINE HCL 0.1 MG PO TABS: 0.1000 mg | ORAL_TABLET | ORAL | 3 refills | Status: DC | PRN

## 2018-06-03 NOTE — ED Notes (Signed)
Pt verbalizes understanding of d/c instructions and denies any further needs at this time. 

## 2018-06-03 NOTE — Addendum Note (Signed)
Addended by: Ashok Norris on: 06/03/2018 03:52 PM   Modules accepted: Orders
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
## 2018-06-03 NOTE — ED Triage Notes (Signed)
She went to her MD today because her BP was elevated this am when she checked it at home. Tightness in her chest since last night.

## 2018-06-03 NOTE — ED Notes (Signed)
Pt. Reports she had a tightness located in the center of her chest around lunch today rates a 1-2/10 as pressure.  No pain or pressure at this time.  Pt. States she has no nausea or dizziness or none noted at time of pressure today.  Pt. In no distress with no SHOB noted.  Pt. Reports she was at work doing paper work today when this began.
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
## 2018-06-03 NOTE — Discharge Instructions (Addendum)
You were evaluated in the emergency department for elevated blood pressures and chest discomfort.  You had blood work EKG and chest x-ray that did not show an obvious cause of your symptoms.  Will be important for you to follow-up with your doctor for continued management of this and also your blood pressure.  Return if any worsening symptoms.

## 2018-06-03 NOTE — Patient Instructions (Signed)
Medication Instructions:  Your physician has recommended you make the following change in your medication:   START: clonidine 0.1 mg as needed for systolic blood pressure >794.   Labwork: None.  Testing/Procedures: None/  Follow-Up: Your physician recommends that you schedule a follow-up appointment in: 3 weeks.    Any Other Special Instructions Will Be Listed Below (If Applicable).  Dr. Agustin Cree recommends you go to the emergency department today to get your blood pressure under control.      If you need a refill on your cardiac medications before your next appointment, please call your pharmacy.

## 2018-06-03 NOTE — ED Notes (Signed)
Family at bedside. 

## 2018-06-04 NOTE — Addendum Note (Signed)
Addended by: Anselm Pancoast on: 06/04/2018 08:41 AM   Modules accepted: Orders

## 2018-06-18 ENCOUNTER — Telehealth: Payer: Self-pay | Admitting: Cardiology

## 2018-06-18 ENCOUNTER — Other Ambulatory Visit: Payer: Self-pay

## 2018-06-18 MED ORDER — HYDRALAZINE HCL 50 MG PO TABS: 75.0000 mg | ORAL_TABLET | Freq: Three times a day (TID) | ORAL | 2 refills | Status: DC

## 2018-06-18 NOTE — Telephone Encounter (Signed)
° ° ° °  1. Which medications need to be refilled? (please list name of each medication and dose if known) hydralazine 50mg   2. Which pharmacy/location (including street and city if local pharmacy) is medication to be sent to? [facility] in [place] on [address] [phone]  3. Do they need a 30 day or 90 day supply? [place]

## 2018-06-18 NOTE — Telephone Encounter (Signed)
Med refill has been sent. 

## 2018-06-19 DIAGNOSIS — Z0000 Encounter for general adult medical examination without abnormal findings: Secondary | ICD-10-CM | POA: Diagnosis not present

## 2018-06-19 DIAGNOSIS — Z9181 History of falling: Secondary | ICD-10-CM | POA: Diagnosis not present

## 2018-06-19 DIAGNOSIS — E669 Obesity, unspecified: Secondary | ICD-10-CM | POA: Diagnosis not present

## 2018-06-19 DIAGNOSIS — Z6833 Body mass index (BMI) 33.0-33.9, adult: Secondary | ICD-10-CM | POA: Diagnosis not present

## 2018-06-19 DIAGNOSIS — Z1331 Encounter for screening for depression: Secondary | ICD-10-CM | POA: Diagnosis not present

## 2018-06-19 DIAGNOSIS — N959 Unspecified menopausal and perimenopausal disorder: Secondary | ICD-10-CM | POA: Diagnosis not present

## 2018-06-19 DIAGNOSIS — Z136 Encounter for screening for cardiovascular disorders: Secondary | ICD-10-CM | POA: Diagnosis not present

## 2018-06-19 DIAGNOSIS — Z1239 Encounter for other screening for malignant neoplasm of breast: Secondary | ICD-10-CM | POA: Diagnosis not present

## 2018-06-19 DIAGNOSIS — E785 Hyperlipidemia, unspecified: Secondary | ICD-10-CM | POA: Diagnosis not present

## 2018-07-01 ENCOUNTER — Ambulatory Visit (INDEPENDENT_AMBULATORY_CARE_PROVIDER_SITE_OTHER): Payer: Medicare Other | Admitting: Cardiology

## 2018-07-01 ENCOUNTER — Encounter: Payer: Self-pay | Admitting: Cardiology

## 2018-07-01 VITALS — BP 140/70 | HR 66 | Ht 62.0 in | Wt 184.4 lb

## 2018-07-01 DIAGNOSIS — E119 Type 2 diabetes mellitus without complications: Secondary | ICD-10-CM | POA: Diagnosis not present

## 2018-07-01 DIAGNOSIS — I5181 Takotsubo syndrome: Secondary | ICD-10-CM

## 2018-07-01 DIAGNOSIS — E7800 Pure hypercholesterolemia, unspecified: Secondary | ICD-10-CM | POA: Diagnosis not present

## 2018-07-01 DIAGNOSIS — I10 Essential (primary) hypertension: Secondary | ICD-10-CM | POA: Diagnosis not present

## 2018-07-01 DIAGNOSIS — I2510 Atherosclerotic heart disease of native coronary artery without angina pectoris: Secondary | ICD-10-CM

## 2018-07-01 MED ORDER — LOSARTAN POTASSIUM 100 MG PO TABS: 100.0000 mg | ORAL_TABLET | Freq: Every day | ORAL | 1 refills | Status: DC

## 2018-07-01 MED ORDER — MINOXIDIL 10 MG PO TABS: 10.0000 mg | ORAL_TABLET | Freq: Every day | ORAL | 1 refills | Status: DC

## 2018-07-01 MED ORDER — FUROSEMIDE 40 MG PO TABS: 40.0000 mg | ORAL_TABLET | Freq: Every day | ORAL | 1 refills | Status: DC

## 2018-07-01 MED ORDER — SPIRONOLACTONE 25 MG PO TABS: 25.0000 mg | ORAL_TABLET | Freq: Every day | ORAL | 1 refills | Status: DC

## 2018-07-01 MED ORDER — SIMVASTATIN 40 MG PO TABS: 40.0000 mg | ORAL_TABLET | Freq: Every day | ORAL | 1 refills | Status: DC

## 2018-07-01 MED ORDER — CLONIDINE HCL 0.2 MG PO TABS: 0.2000 mg | ORAL_TABLET | Freq: Two times a day (BID) | ORAL | 1 refills | Status: DC

## 2018-07-01 NOTE — Patient Instructions (Signed)

## 2018-07-01 NOTE — Progress Notes (Signed)
Cardiology Office Note:    Date:  07/01/2018   ID:  Hayley Simon, DOB 04/22/1946, MRN [id number]  PCP:  Hayley Dandy, NP  Cardiologist:  Hayley Campus, MD    Referring MD: Hayley Dandy, NP   Chief Complaint  Patient presents with  . 3 week follow up  Doing well  History of Present Illness:    Hayley Simon is a 72 y.o. female with hypertension also coronary artery disease status post cardiac catheterization in summer 2018 which showed only luminal disease of LAD at the same time she was discovered to have what appears to be Takotsubo with ejection fraction 30 to 35%.  However after appropriate management her echocardiogram repeated in March 2019 showing ejection fraction 50 to 55%.  Last after I have seen her she got very high blood pressure she actually was referred to the emergency room for evaluation her biochemical markers were normal and she did quite well since that time her blood pressure seems to be doing well she is active she does think she has no difficulty doing it no chest pain tightness squeezing pressure burning chest.  Past Medical History:  Diagnosis Date  . Anxiety and depression   . Coronary artery disease   . Diabetes mellitus without complication ([place])   . Hypertension   . PVD (peripheral vascular disease) ([place])   . Stress-induced cardiomyopathy     Past Surgical History:  Procedure Laterality Date  . ABDOMINAL HYSTERECTOMY    . BREAST BIOPSY    . CARDIAC CATHETERIZATION    . CESAREAN SECTION    . TUMOR REMOVAL      Current Medications: Current Meds  Medication Sig  . ALPRAZolam (XANAX) 0.5 MG tablet Take 0.5 mg by mouth as needed.  Marland Kitchen aspirin 81 MG tablet Take 81 mg by mouth daily.   . Calcium Carb-Ergocalciferol 250-125 MG-UNIT TABS Take 1 tablet by mouth 2 (two) times daily.  . cloNIDine (CATAPRES) 0.1 MG tablet Take 1 tablet (0.1 mg total) by mouth as needed (for systolic bp >974).  . cloNIDine (CATAPRES) 0.2 MG tablet Take 0.2 mg by mouth 2  (two) times daily.  Marland Kitchen FLUoxetine (PROZAC) 20 MG capsule Take 1 capsule by mouth daily.  . furosemide (LASIX) 40 MG tablet Take 1 tablet by mouth daily.  Marland Kitchen glimepiride (AMARYL) 4 MG tablet Take 1 tablet by mouth daily.  . hydrALAZINE (APRESOLINE) 50 MG tablet Take 1.5 tablets (75 mg total) by mouth 3 (three) times daily.  Marland Kitchen loratadine (CLARITIN) 10 MG tablet Take 1 tablet by mouth daily.  Marland Kitchen losartan (COZAAR) 100 MG tablet Take 1 tablet by mouth daily.  . metFORMIN (GLUCOPHAGE) 1000 MG tablet Take 1 tablet by mouth 2 (two) times daily.   . metoprolol tartrate (LOPRESSOR) 50 MG tablet Take 1 tablet by mouth 2 (two) times daily.  . minoxidil (LONITEN) 10 MG tablet Take 1 tablet by mouth daily.  . Multiple Vitamin (MULTIVITAMIN) capsule Take 1 capsule by mouth daily.  . simvastatin (ZOCOR) 40 MG tablet Take 1 tablet by mouth daily.  Marland Kitchen spironolactone (ALDACTONE) 25 MG tablet Take 1 tablet by mouth daily.     Allergies:   Penicillins   Social History   Socioeconomic History  . Marital status: Married    Spouse name: Not on file  . Number of children: Not on file  . Years of education: Not on file  . Highest education level: Not on file  Occupational History  . Not on file  Social Needs  . Financial resource strain: Not on file  . Food insecurity:    Worry: Not on file    Inability: Not on file  . Transportation needs:    Medical: Not on file    Non-medical: Not on file  Tobacco Use  . Smoking status: Never Smoker  . Smokeless tobacco: Never Used  Substance and Sexual Activity  . Alcohol use: Not Currently  . Drug use: Never  . Sexual activity: Not on file  Lifestyle  . Physical activity:    Days per week: Not on file    Minutes per session: Not on file  . Stress: Not on file  Relationships  . Social connections:    Talks on phone: Not on file    Gets together: Not on file    Attends religious service: Not on file    Active member of club or organization: Not on file     Attends meetings of clubs or organizations: Not on file    Relationship status: Not on file  Other Topics Concern  . Not on file  Social History Narrative  . Not on file     Family History: The patient's family history includes Healthy in her father and mother. ROS:   Please see the history of present illness.    All 14 point review of systems negative except as described per history of present illness  EKGs/Labs/Other Studies Reviewed:      Recent Labs: 06/03/2018: BUN 17; Creatinine, Ser 1.01; Hemoglobin 13.4; Platelets 259; Potassium 3.6; Sodium 141  Recent Lipid Panel No results found for: CHOL, TRIG, HDL, CHOLHDL, VLDL, LDLCALC, LDLDIRECT  Physical Exam:    VS:  BP 140/70   Pulse 66   Ht 5\' 2"  (1.575 m)   Wt 184 lb 6.4 oz (83.6 kg)   SpO2 94%   BMI 33.73 kg/m     Wt Readings from Last 3 Encounters:  07/01/18 184 lb 6.4 oz (83.6 kg)  06/03/18 182 lb 15.7 oz (83 kg)  06/03/18 183 lb (83 kg)     GEN:  Well nourished, well developed in no acute distress HEENT: Normal NECK: No JVD; No carotid bruits LYMPHATICS: No lymphadenopathy CARDIAC: RRR, no murmurs, no rubs, no gallops RESPIRATORY:  Clear to auscultation without rales, wheezing or rhonchi  ABDOMEN: Soft, non-tender, non-distended MUSCULOSKELETAL:  No edema; No deformity  SKIN: Warm and dry LOWER EXTREMITIES: no swelling NEUROLOGIC:  Alert and oriented x 3 PSYCHIATRIC:  Normal affect   ASSESSMENT:    1. Essential hypertension   2. Coronary artery disease involving native coronary artery of native heart without angina pectoris   3. Takotsubo syndrome   4. Type 2 diabetes mellitus without complication, without long-term current use of insulin ([place])   5. Pure hypercholesterolemia    PLAN:    In order of problems listed above:  1. Essential hypertension blood pressure appears to be well controlled continue present management. 2. Coronary artery disease cardiac catheterization December 2018 only luminal  disease.  Asymptomatic now 3. History of Takotsubo with normalization of left ventricular ejection fraction basal echo from March 2019 4. Type 2 diabetes doing well from that point review. 5. Dyslipidemia on appropriate medications and cholesterol and appropriate level. 6.    Medication Adjustments/Labs and Tests Ordered: Current medicines are reviewed at length with the patient today.  Concerns regarding medicines are outlined above.  No orders of the defined types were placed in this encounter.  Medication changes: No orders of  the defined types were placed in this encounter.   Signed, Park Liter, MD, [facility] 07/01/2018 3:12 PM    [place]

## 2018-07-01 NOTE — Addendum Note (Signed)
Addended by: Ashok Norris on: 07/01/2018 03:19 PM   Modules accepted: Orders

## 2018-07-10 DIAGNOSIS — H25813 Combined forms of age-related cataract, bilateral: Secondary | ICD-10-CM | POA: Diagnosis not present

## 2018-07-10 DIAGNOSIS — E119 Type 2 diabetes mellitus without complications: Secondary | ICD-10-CM | POA: Diagnosis not present

## 2018-08-25 ENCOUNTER — Other Ambulatory Visit: Payer: Self-pay | Admitting: Cardiology

## 2018-10-04 DIAGNOSIS — F320 Major depressive disorder, single episode, mild: Secondary | ICD-10-CM | POA: Diagnosis not present

## 2018-10-04 DIAGNOSIS — E119 Type 2 diabetes mellitus without complications: Secondary | ICD-10-CM | POA: Diagnosis not present

## 2018-10-04 DIAGNOSIS — E785 Hyperlipidemia, unspecified: Secondary | ICD-10-CM | POA: Diagnosis not present

## 2018-10-04 DIAGNOSIS — I10 Essential (primary) hypertension: Secondary | ICD-10-CM | POA: Diagnosis not present

## 2018-11-17 ENCOUNTER — Other Ambulatory Visit: Payer: Self-pay | Admitting: Cardiology

## 2018-12-03 ENCOUNTER — Telehealth: Payer: Self-pay | Admitting: Emergency Medicine

## 2018-12-03 NOTE — Telephone Encounter (Signed)
Patient's son informed that patient will be rescheduled as a virtual visit tomorrow. He verbally understands and will inform patient.

## 2018-12-04 ENCOUNTER — Telehealth (INDEPENDENT_AMBULATORY_CARE_PROVIDER_SITE_OTHER): Payer: Medicare Other | Admitting: Cardiology

## 2018-12-04 ENCOUNTER — Other Ambulatory Visit: Payer: Self-pay

## 2018-12-04 ENCOUNTER — Encounter: Payer: Self-pay | Admitting: Cardiology

## 2018-12-04 VITALS — BP 126/55 | HR 59 | Ht 62.0 in | Wt 178.0 lb

## 2018-12-04 DIAGNOSIS — I5181 Takotsubo syndrome: Secondary | ICD-10-CM

## 2018-12-04 DIAGNOSIS — E7800 Pure hypercholesterolemia, unspecified: Secondary | ICD-10-CM

## 2018-12-04 DIAGNOSIS — I2510 Atherosclerotic heart disease of native coronary artery without angina pectoris: Secondary | ICD-10-CM | POA: Diagnosis not present

## 2018-12-04 DIAGNOSIS — E119 Type 2 diabetes mellitus without complications: Secondary | ICD-10-CM

## 2018-12-04 DIAGNOSIS — I10 Essential (primary) hypertension: Secondary | ICD-10-CM

## 2018-12-04 NOTE — Patient Instructions (Signed)
Medication Instructions:  Your physician recommends that you continue on your current medications as directed. Please refer to the Current Medication list given to you today.  If you need a refill on your cardiac medications before your next appointment, please call your pharmacy.   Lab work: None.  If you have labs (blood work) drawn today and your tests are completely normal, you will receive your results only by: . MyChart Message (if you have MyChart) OR . A paper copy in the mail If you have any lab test that is abnormal or we need to change your treatment, we will call you to review the results.  Testing/Procedures: None.   Follow-Up: At [facility], you and your health needs are our priority.  As part of our continuing mission to provide you with exceptional heart care, we have created designated Provider Care Teams.  These Care Teams include your primary Cardiologist (physician) and Advanced Practice Providers (APPs -  Physician Assistants and Nurse Practitioners) who all work together to provide you with the care you need, when you need it. You will need a follow up appointment in 3 months.  Please call our office 2 months in advance to schedule this appointment.  You may see No primary care provider on file. or another member of our [facility] Provider Team in [place]: Brian Munley, MD . Rajan Revankar, MD  Any Other Special Instructions Will Be Listed Below (If Applicable).     

## 2018-12-04 NOTE — Progress Notes (Signed)
Evaluation Performed:  Follow-up visit  This visit type was conducted due to national recommendations for restrictions regarding the COVID-19 Pandemic (e.g. social distancing).  This format is felt to be most appropriate for this patient at this time.  All issues noted in this document were discussed and addressed.  No physical exam was performed (except for noted visual exam findings with Video Visits).  Please refer to the patient's chart (MyChart message for video visits and phone note for telephone visits) for the patient's consent to telehealth for [facility].  Date:  12/04/2018  ID: Hayley Simon, DOB 12/08/1945, MRN [id number]   Patient Location:  [place] [address]   Provider location:   [place] Office  PCP:  Lowella Dandy, NP  Cardiologist:  Jenne Campus, MD     Chief Complaint: Just regular follow-up visit I am doing well  History of Present Illness:    Hayley Simon is a 73 y.o. female  who presents via audio/video conferencing for a telehealth visit today.  She does have hypertension also history of tach at stable however last echocardiogram done in March 2019 showed normalization of left ventricular ejection fraction.  Overall she is doing well denies have any chest pain tightness squeezing pressure burning chest.  I spent a long time talking about the current coronavirus infection.  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  She try to walk on a regular basis.  She described to have some dry mouth because of medication that she takes but overall doing well   The patient does symptoms concerning for COVID-19 infection (fever, chills, cough, or new SHORTNESS OF BREATH).    Prior CV studies:   The following studies were reviewed today:       Past Medical History:  Diagnosis Date  . Anxiety and depression    . Coronary artery disease   . Diabetes mellitus without complication ([place])   . Hypertension   . PVD (peripheral vascular disease) ([place])   . Stress-induced cardiomyopathy     Past Surgical History:  Procedure Laterality Date  . ABDOMINAL HYSTERECTOMY    . BREAST BIOPSY    . CARDIAC CATHETERIZATION    . CESAREAN SECTION    . TUMOR REMOVAL       Current Meds  Medication Sig  . ALPRAZolam (XANAX) 0.5 MG tablet Take 0.5 mg by mouth as needed.  Marland Kitchen aspirin 81 MG tablet Take 81 mg by mouth daily.   . Calcium Carb-Ergocalciferol 250-125 MG-UNIT TABS Take 1 tablet by mouth 2 (two) times daily.  . cloNIDine (CATAPRES) 0.1 MG tablet TAKE 1 TABLET BY MOUTH AS NEEDED (FOR SYSTOLIC BP >202).  . cloNIDine (CATAPRES) 0.2 MG tablet Take 1 tablet (0.2 mg total) by mouth 2 (two) times daily.  Marland Kitchen FLUoxetine (PROZAC) 20 MG capsule Take 1 capsule by mouth daily.  . furosemide (LASIX) 40 MG tablet Take 1 tablet (40 mg total) by mouth daily.  Marland Kitchen glimepiride (AMARYL) 4 MG tablet Take 1 tablet by mouth daily.  . hydrALAZINE (APRESOLINE) 50 MG tablet TAKE 1.5 TABLETS (75 MG TOTAL) BY MOUTH 3 (THREE) TIMES DAILY.  Marland Kitchen loratadine (CLARITIN) 10 MG tablet Take 1 tablet by mouth daily.  Marland Kitchen losartan (COZAAR) 100 MG tablet Take 1 tablet (100 mg total) by mouth daily.  . metFORMIN (GLUCOPHAGE) 1000 MG tablet Take 1 tablet by mouth  2 (two) times daily.   . metoprolol tartrate (LOPRESSOR) 50 MG tablet Take 1 tablet by mouth 2 (two) times daily.  . minoxidil (LONITEN) 10 MG tablet Take 1 tablet (10 mg total) by mouth daily.  . Multiple Vitamin (MULTIVITAMIN) capsule Take 1 capsule by mouth daily.  . simvastatin (ZOCOR) 40 MG tablet Take 1 tablet (40 mg total) by mouth daily.  Marland Kitchen spironolactone (ALDACTONE) 25 MG tablet Take 1 tablet (25 mg total) by mouth daily.      Family History: The patient's family history includes Healthy in her father and mother.   ROS:   Please see the history of present illness.     All  other systems reviewed and are negative.   Labs/Other Tests and Data Reviewed:     Recent Labs: 06/03/2018: BUN 17; Creatinine, Ser 1.01; Hemoglobin 13.4; Platelets 259; Potassium 3.6; Sodium 141  Recent Lipid Panel No results found for: CHOL, TRIG, HDL, CHOLHDL, VLDL, LDLCALC, LDLDIRECT    Exam:    Vital Signs:  Wt 141 lb (64 kg)   BMI 24.20 kg/m    Well nourished, well developed female in no acute distress.   Diagnosis for this visit:   1. Coronary artery disease involving native coronary artery of native heart without angina pectoris   2. Essential hypertension   3. Takotsubo syndrome   4. Pure hypercholesterolemia   5. Type 2 diabetes mellitus without complication, without long-term current use of insulin ([place])      ASSESSMENT & PLAN:    1.  Coronary artery disease: Stable without any chest pain tightness squeezing pressure been chest in spite of the fact that she is fairly active.  We will continue present management 2.  Essential hypertension blood pressure well controlled she will forward to [place] results of her blood pressure results as well as blood test. 3.  Taka - Tsubo syndrome: We will repeat echocardiogram and coronavirus situation will be more stable. 4.  Dyslipidemia.  She will bring the results of her cholesterol from primary care physician that was done in January she tells me it was good 5.  Diabetes: Stable on appropriate medications which I will continue   COVID-19 Education: The signs and symptoms of COVID-19 were discussed with the patient and how to seek care for testing (follow up with PCP or arrange E-visit).  The importance of social distancing was discussed today.  Patient Risk:   After full review of this patients clinical status, I feel that they are at least moderate risk at this time.  Time:   Today, I have spent 15 minutes with the patient with telehealth technology discussing pt health issues.     Medication Adjustments/Labs and Tests  Ordered: Current medicines are reviewed at length with the patient today.  Concerns regarding medicines are outlined above.  No orders of the defined types were placed in this encounter.  Medication changes: No orders of the defined types were placed in this encounter.    Disposition: Tele-visits in 3 months.  She does have a computer will be able to do video conferencing  Signed, Park Liter, MD, [facility] 12/04/2018 3:18 PM    [place]

## 2018-12-22 ENCOUNTER — Other Ambulatory Visit: Payer: Self-pay | Admitting: Cardiology

## 2019-01-27 ENCOUNTER — Other Ambulatory Visit: Payer: Self-pay | Admitting: Cardiology

## 2019-02-07 DIAGNOSIS — E785 Hyperlipidemia, unspecified: Secondary | ICD-10-CM | POA: Diagnosis not present

## 2019-02-07 DIAGNOSIS — I10 Essential (primary) hypertension: Secondary | ICD-10-CM | POA: Diagnosis not present

## 2019-02-07 DIAGNOSIS — M542 Cervicalgia: Secondary | ICD-10-CM | POA: Diagnosis not present

## 2019-02-07 DIAGNOSIS — E119 Type 2 diabetes mellitus without complications: Secondary | ICD-10-CM | POA: Diagnosis not present

## 2019-02-13 ENCOUNTER — Other Ambulatory Visit: Payer: Self-pay

## 2019-02-13 MED ORDER — HYDRALAZINE HCL 50 MG PO TABS: 75.0000 mg | ORAL_TABLET | Freq: Three times a day (TID) | ORAL | 0 refills | Status: DC

## 2019-02-15 ENCOUNTER — Other Ambulatory Visit: Payer: Self-pay | Admitting: Cardiology

## 2019-02-16 ENCOUNTER — Other Ambulatory Visit: Payer: Self-pay | Admitting: Cardiology

## 2019-02-26 DIAGNOSIS — M8580 Other specified disorders of bone density and structure, unspecified site: Secondary | ICD-10-CM | POA: Diagnosis not present

## 2019-02-26 DIAGNOSIS — M85851 Other specified disorders of bone density and structure, right thigh: Secondary | ICD-10-CM | POA: Diagnosis not present

## 2019-02-26 DIAGNOSIS — N959 Unspecified menopausal and perimenopausal disorder: Secondary | ICD-10-CM | POA: Diagnosis not present

## 2019-03-09 ENCOUNTER — Other Ambulatory Visit: Payer: Self-pay | Admitting: Cardiology

## 2019-03-10 ENCOUNTER — Telehealth: Payer: Medicare Other | Admitting: Cardiology

## 2019-03-10 ENCOUNTER — Other Ambulatory Visit: Payer: Self-pay

## 2019-03-25 ENCOUNTER — Other Ambulatory Visit: Payer: Self-pay | Admitting: Cardiology

## 2019-04-02 DIAGNOSIS — Z1231 Encounter for screening mammogram for malignant neoplasm of breast: Secondary | ICD-10-CM | POA: Diagnosis not present

## 2019-04-11 ENCOUNTER — Other Ambulatory Visit: Payer: Self-pay | Admitting: Cardiology

## 2019-04-21 IMAGING — CR DG CHEST 2V
2 series · 2 of 2 positions shown · non-contrast
Comparison: None.

CLINICAL DATA: Hypertension

EXAM:
CHEST - 2 VIEW

[w chest pa]
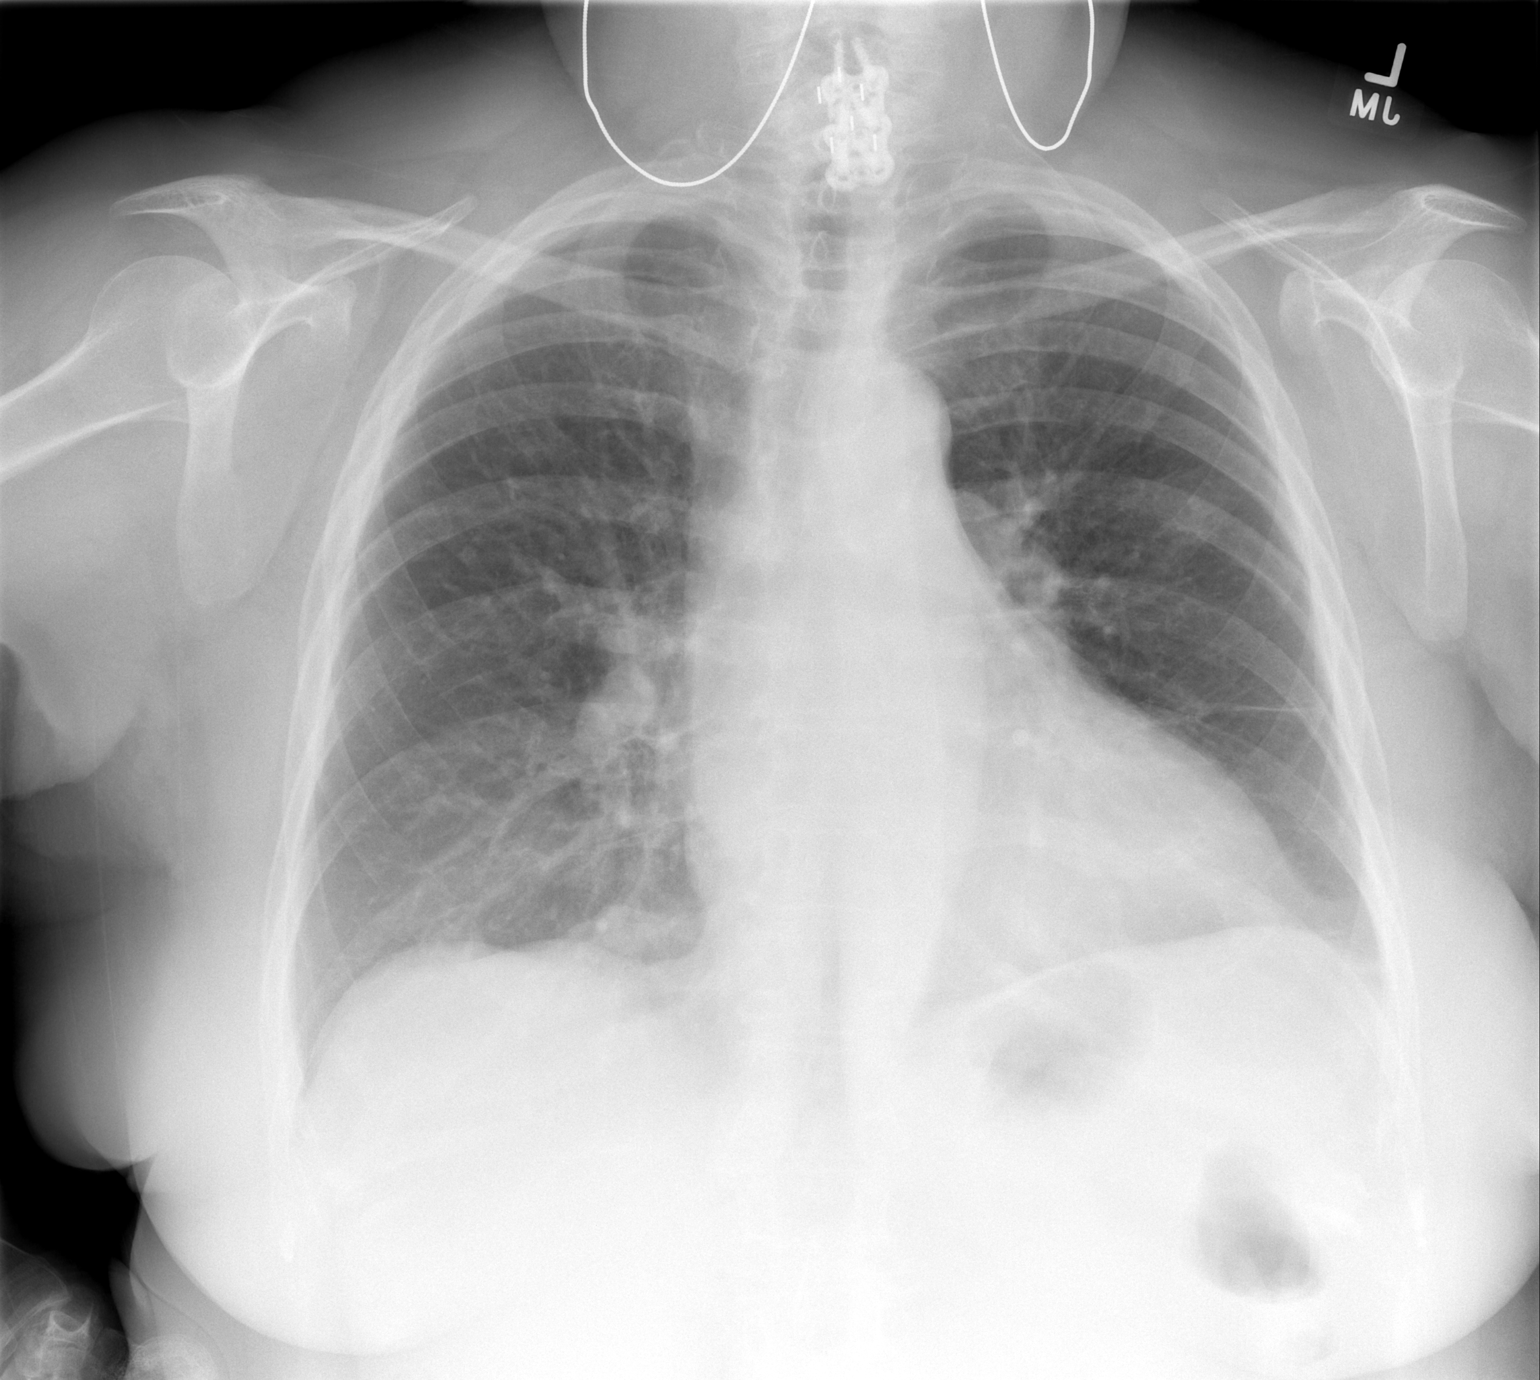

[w chest lat]
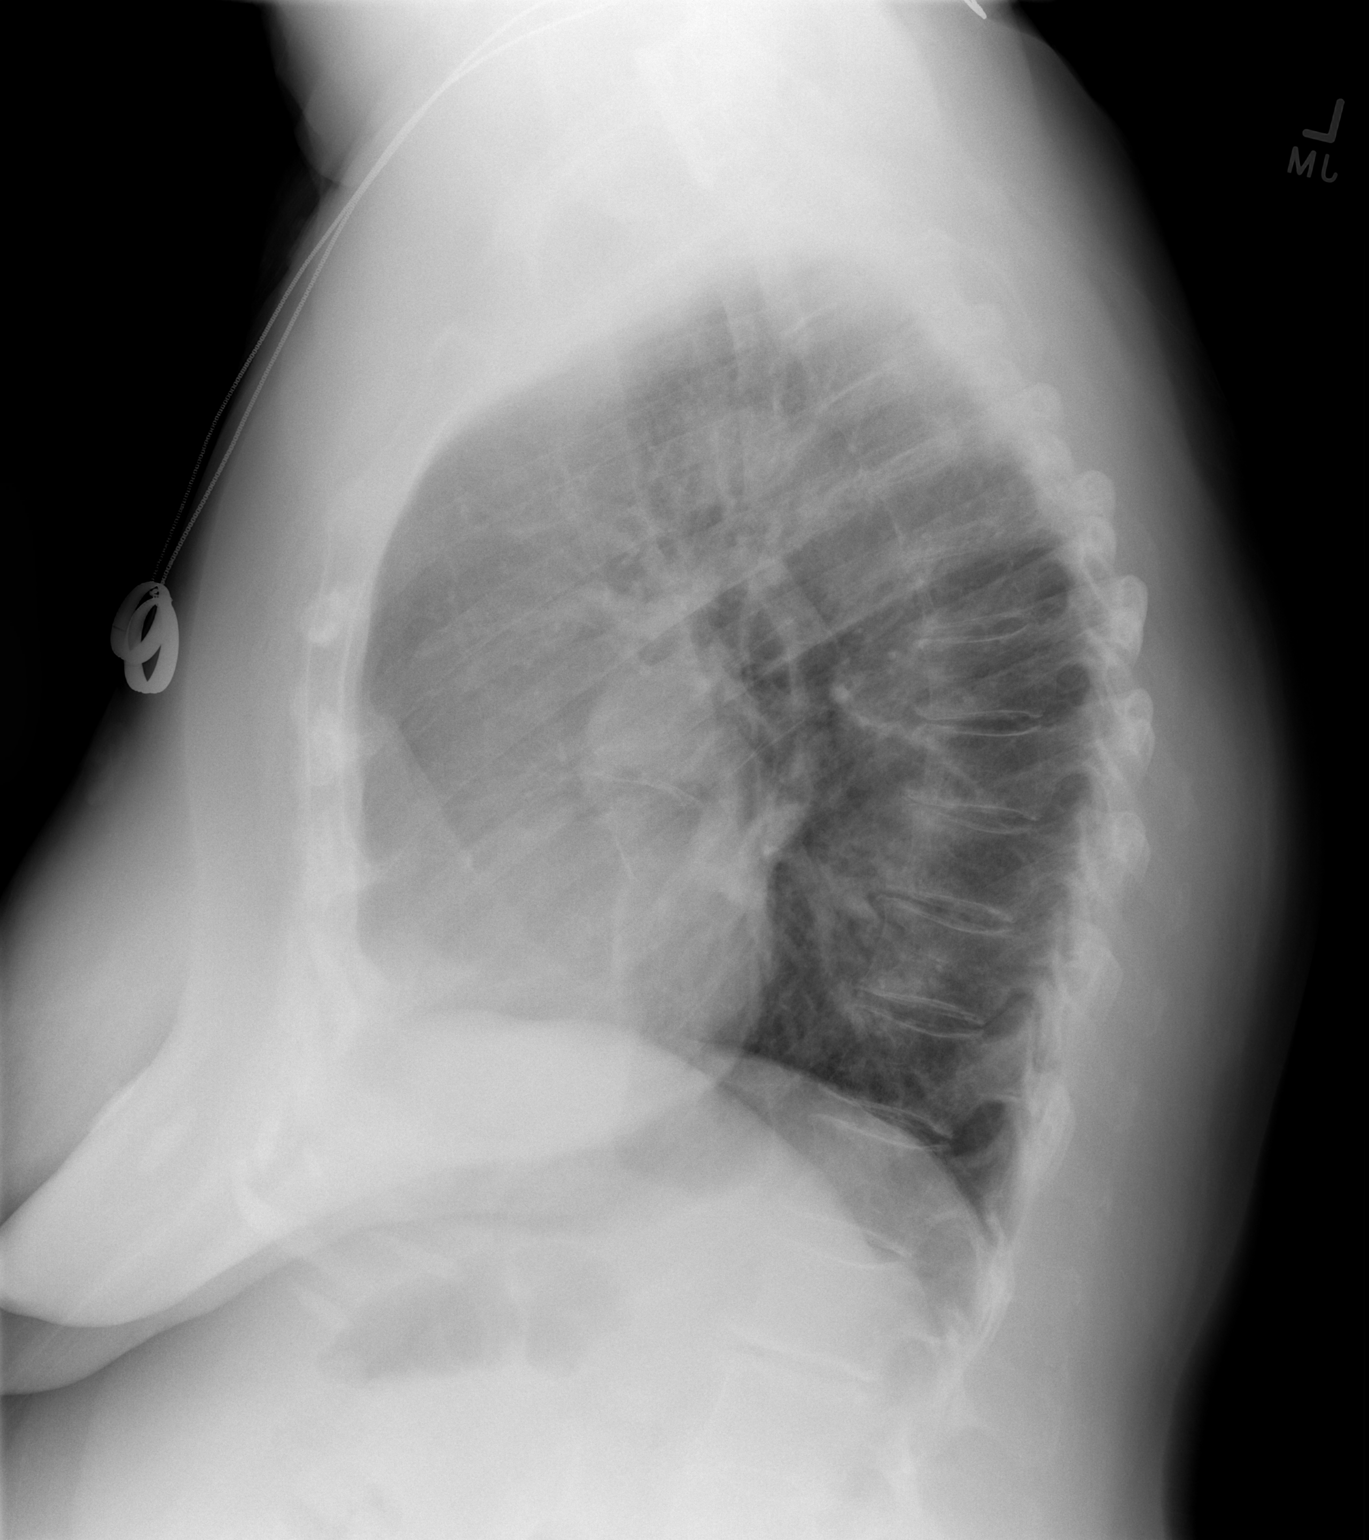

[2 of 2 positions shown; findings below may reference images not displayed]

FINDINGS: The heart size and mediastinal contours are within normal limits.
There is slight uncoiling of the atherosclerotic thoracic aorta
without aneurysm. No pulmonary consolidation, CHF nor effusion. ACDF
of the included lower cervical spine.
IMPRESSION: No active cardiopulmonary disease. Minimal aortic atherosclerosis
without aneurysm.

## 2019-05-14 DIAGNOSIS — M542 Cervicalgia: Secondary | ICD-10-CM | POA: Diagnosis not present

## 2019-05-22 DIAGNOSIS — M546 Pain in thoracic spine: Secondary | ICD-10-CM | POA: Diagnosis not present

## 2019-05-22 DIAGNOSIS — M542 Cervicalgia: Secondary | ICD-10-CM | POA: Diagnosis not present

## 2019-05-28 DIAGNOSIS — M542 Cervicalgia: Secondary | ICD-10-CM | POA: Diagnosis not present

## 2019-05-28 DIAGNOSIS — M546 Pain in thoracic spine: Secondary | ICD-10-CM | POA: Diagnosis not present

## 2019-06-03 DIAGNOSIS — M542 Cervicalgia: Secondary | ICD-10-CM | POA: Diagnosis not present

## 2019-06-03 DIAGNOSIS — M546 Pain in thoracic spine: Secondary | ICD-10-CM | POA: Diagnosis not present

## 2019-06-05 DIAGNOSIS — M542 Cervicalgia: Secondary | ICD-10-CM | POA: Diagnosis not present

## 2019-06-05 DIAGNOSIS — M546 Pain in thoracic spine: Secondary | ICD-10-CM | POA: Diagnosis not present

## 2019-06-07 ENCOUNTER — Other Ambulatory Visit: Payer: Self-pay | Admitting: Cardiology

## 2019-06-10 DIAGNOSIS — E785 Hyperlipidemia, unspecified: Secondary | ICD-10-CM | POA: Diagnosis not present

## 2019-06-10 DIAGNOSIS — Z139 Encounter for screening, unspecified: Secondary | ICD-10-CM | POA: Diagnosis not present

## 2019-06-10 DIAGNOSIS — I10 Essential (primary) hypertension: Secondary | ICD-10-CM | POA: Diagnosis not present

## 2019-06-10 DIAGNOSIS — F320 Major depressive disorder, single episode, mild: Secondary | ICD-10-CM | POA: Diagnosis not present

## 2019-06-10 DIAGNOSIS — E119 Type 2 diabetes mellitus without complications: Secondary | ICD-10-CM | POA: Diagnosis not present

## 2019-07-01 ENCOUNTER — Other Ambulatory Visit: Payer: Self-pay | Admitting: Cardiology

## 2019-07-01 NOTE — Telephone Encounter (Signed)
Rx refill sent to pharmacy. 

## 2019-07-10 DIAGNOSIS — M1610 Unilateral primary osteoarthritis, unspecified hip: Secondary | ICD-10-CM | POA: Diagnosis not present

## 2019-07-16 ENCOUNTER — Other Ambulatory Visit: Payer: Self-pay | Admitting: Cardiology

## 2019-07-16 DIAGNOSIS — H524 Presbyopia: Secondary | ICD-10-CM | POA: Diagnosis not present

## 2019-07-16 DIAGNOSIS — H02831 Dermatochalasis of right upper eyelid: Secondary | ICD-10-CM | POA: Diagnosis not present

## 2019-07-16 DIAGNOSIS — R52 Pain, unspecified: Secondary | ICD-10-CM | POA: Diagnosis not present

## 2019-07-16 DIAGNOSIS — E119 Type 2 diabetes mellitus without complications: Secondary | ICD-10-CM | POA: Diagnosis not present

## 2019-07-16 DIAGNOSIS — Z79899 Other long term (current) drug therapy: Secondary | ICD-10-CM | POA: Diagnosis not present

## 2019-07-16 DIAGNOSIS — M79609 Pain in unspecified limb: Secondary | ICD-10-CM | POA: Diagnosis not present

## 2019-07-16 DIAGNOSIS — H25813 Combined forms of age-related cataract, bilateral: Secondary | ICD-10-CM | POA: Diagnosis not present

## 2019-07-16 DIAGNOSIS — E559 Vitamin D deficiency, unspecified: Secondary | ICD-10-CM | POA: Diagnosis not present

## 2019-07-16 DIAGNOSIS — J9811 Atelectasis: Secondary | ICD-10-CM | POA: Diagnosis not present

## 2019-07-16 DIAGNOSIS — Z01818 Encounter for other preprocedural examination: Secondary | ICD-10-CM | POA: Diagnosis not present

## 2019-07-16 LAB — PROTIME-INR

## 2019-07-17 ENCOUNTER — Telehealth: Payer: Self-pay | Admitting: Cardiology

## 2019-07-17 NOTE — Telephone Encounter (Signed)
°*  STAT* If patient is at the pharmacy, call can be transferred to refill team.   1. Which medications need to be refilled? (please list name of each medication and dose if known) Hydralazine 50mg   2. Which pharmacy/location (including street and city if local pharmacy) is medication to [facility] sent to?[facility] [address] [place]  3. Do they need a 30 day or 90 day supply? [place]

## 2019-07-18 MED ORDER — HYDRALAZINE HCL 50 MG PO TABS: 75.0000 mg | ORAL_TABLET | Freq: Three times a day (TID) | ORAL | 0 refills | Status: DC

## 2019-07-18 NOTE — Telephone Encounter (Signed)
Received refill request, overdue for follow-up appt. Phoned patient, scheduled f/u office visit with Laurann Montana NP 11/99/20.     Pt. states she may be having hip surgery in December but is not sure yet, may need pre-op clearance at a later date.     [facility] [address] [place].

## 2019-07-21 ENCOUNTER — Other Ambulatory Visit: Payer: Self-pay

## 2019-07-21 ENCOUNTER — Ambulatory Visit (INDEPENDENT_AMBULATORY_CARE_PROVIDER_SITE_OTHER): Payer: Medicare Other | Admitting: Family

## 2019-07-21 ENCOUNTER — Encounter: Payer: Self-pay | Admitting: Family

## 2019-07-21 VITALS — BP 122/62 | HR 63 | Ht 62.0 in | Wt 179.0 lb

## 2019-07-21 DIAGNOSIS — I10 Essential (primary) hypertension: Secondary | ICD-10-CM | POA: Diagnosis not present

## 2019-07-21 DIAGNOSIS — Z01810 Encounter for preprocedural cardiovascular examination: Secondary | ICD-10-CM

## 2019-07-21 DIAGNOSIS — I2510 Atherosclerotic heart disease of native coronary artery without angina pectoris: Secondary | ICD-10-CM | POA: Diagnosis not present

## 2019-07-21 DIAGNOSIS — I5181 Takotsubo syndrome: Secondary | ICD-10-CM | POA: Diagnosis not present

## 2019-07-21 DIAGNOSIS — E119 Type 2 diabetes mellitus without complications: Secondary | ICD-10-CM

## 2019-07-21 DIAGNOSIS — E7800 Pure hypercholesterolemia, unspecified: Secondary | ICD-10-CM | POA: Diagnosis not present

## 2019-07-21 NOTE — Progress Notes (Signed)
Office Visit    Patient Name: Hayley Simon Date of Encounter: 07/21/2019  Primary Care Provider:  Lowella Dandy, NP Primary Cardiologist:  Jenne Campus, MD Electrophysiologist:  None   Chief Complaint    Hayley Simon is a 74 y.o. female with a hx of hypertension, CAD, Takotsubo syndrome, hyperlipidemia, DM2 presents today for 52-month follow-up of HTN, CAD.  Past Medical History    Past Medical History:  Diagnosis Date   Anxiety and depression    Coronary artery disease    Diabetes mellitus without complication ([facility])    Hypertension    PVD (peripheral vascular disease) ([place])    Stress-induced cardiomyopathy    Past Surgical History:  Procedure Laterality Date   ABDOMINAL HYSTERECTOMY     BREAST BIOPSY     CARDIAC CATHETERIZATION     CESAREAN SECTION     TUMOR REMOVAL      Allergies  Allergies  Allergen Reactions   Penicillins Rash    History of Present Illness    Hayley Simon is a 73 y.o. female with a hx of CAD (NSTEMI 02/2017), HTN, Takotsubo cardiomyopathy (02/2017), HLD, DM 2 last seen 12/04/2018 by Dr. Agustin Cree.  Per previous documentation her echo 11/2017 showed normalization of LVEF.    EKGs/Labs/Other Studies Reviewed:   The following studies were reviewed today: Echo 11/2017 via [facility] Findings Mitral Valve Mild mitral annular calcification. Mild mitral regurgitation. Aortic Valve Structurally normal aortic valve with good leaflet mobility, and no regurgitation. Tricuspid Valve Tricuspid valve is structurally normal. No significant tricuspid regurgitation . Pulmonic Valve Pulmonic valve is structurally normal. No Doppler evidence of pulmonic stenosis or insufficiency. Left Atrium Normal size left atrium. Left Ventricle Ejection fraction is visually estimated at 60-65% Normal left ventricular size and systolic function with no appreciable segmental abnormality. Right Atrium Normal right atrium. Right  Ventricle Mildly dilated right ventricle. Pericardial Effusion No evidence of pericardial effusion. Miscellaneous The aorta is within normal limits. The IVC is normal  EKG:  No EKG today. EKG independently reviewed from 07/17/2019 at [facility] with no acute ST/T wave changes.   Recent Labs: No results found for requested labs within last 8760 hours.  Recent Lipid Panel 06/10/19 via [facility]: total cholesterol 127, HDL 40, DL 60, triglycerides 167  Home Medications   Current Meds  Medication Sig   ALPRAZolam (XANAX) 0.5 MG tablet Take 0.5 mg by mouth as needed.   aspirin 81 MG tablet Take 81 mg by mouth daily.    Calcium Carb-Ergocalciferol 250-125 MG-UNIT TABS Take 1 tablet by mouth 2 (two) times daily.   cloNIDine (CATAPRES) 0.1 MG tablet TAKE 1 TABLET BY MOUTH AS NEEDED (FOR SYSTOLIC BP XX123456).   cloNIDine (CATAPRES) 0.2 MG tablet TAKE 1 TABLET (0.2 MG TOTAL) BY MOUTH 2 (TWO) TIMES DAILY.   FLUoxetine (PROZAC) 20 MG capsule Take 1 capsule by mouth daily.   furosemide (LASIX) 40 MG tablet TAKE 1 TABLET BY MOUTH EVERY DAY   glimepiride (AMARYL) 4 MG tablet Take 1 tablet by mouth daily.   hydrALAZINE (APRESOLINE) 50 MG tablet Take 1.5 tablets (75 mg total) by mouth 3 (three) times daily.   loratadine (CLARITIN) 10 MG tablet Take 1 tablet by mouth daily.   losartan (COZAAR) 100 MG tablet TAKE 1 TABLET BY MOUTH EVERY DAY   metFORMIN (GLUCOPHAGE) 1000 MG tablet Take 1 tablet by mouth 2 (two) times daily.    metoprolol tartrate (LOPRESSOR) 50 MG tablet Take 1 tablet by mouth 2 (two) times  daily.   minoxidil (LONITEN) 10 MG tablet TAKE 1 TABLET BY MOUTH EVERY DAY   Multiple Vitamin (MULTIVITAMIN) capsule Take 1 capsule by mouth daily.   simvastatin (ZOCOR) 40 MG tablet TAKE 1 TABLET BY MOUTH EVERY DAY   spironolactone (ALDACTONE) 25 MG tablet TAKE 1 TABLET BY MOUTH EVERY DAY      Review of Systems    All other systems reviewed and are otherwise negative except as noted  above.  Physical Exam    VS:  BP 122/62 (BP Location: Left Arm, Patient Position: Sitting, Cuff Size: Normal)    Pulse 63    Ht 5\' 2"  (1.575 m)    Wt 179 lb (81.2 kg)    SpO2 95%    BMI 32.74 kg/m  , BMI Body mass index is 32.74 kg/m. GEN: Well nourished, well developed, in no acute distress. HEENT: normal. Neck: Supple, no JVD, carotid bruits, or masses. Cardiac: RRR, no murmurs, rubs, or gallops. No clubbing, cyanosis, edema.  Radials/DP/PT 2+ and equal bilaterally.  Respiratory:  Respirations regular and unlabored, clear to auscultation bilaterally. GI: Soft, nontender, nondistended, BS + x 4. MS: No deformity or atrophy. Skin: Warm and dry, no rash. Neuro:  Strength and sensation are intact. Psych: Normal affect.  Assessment & Plan    1. Preprocedure cardiovascular exam - Plans for hip surgery in December. CAD is stable with no anginal symptoms. EKG independently reviewed 07/17/19 SR with no acute ST/ T wave changes. Her cardiac medications are optimized. She will have echocardiogram updated prior to clearance. Echo scheduled for one week from today. According to the Revised Cardiac Risk Index (RCRI), her Perioperative Risk of Major Cardiac Event is (%): 6.6. Her Functional Capacity in METs is: 4.64 according to the [facility] Activity Status Index (DASI).  2. CAD - Stable with no anginal symptoms. Continue present GDMT of aspirin, beta blocker, statin. Continue low sodium heart healthy diet. Continue regular cardiovascular exercise as tolerated in setting of orthopedic issues.  3. HTN - BP well controlled. Continue present antihypertensive regimen.  4. HLD - Lipid panel 06/10/19 at goal. Continue present statin.  5. Takotsubo syndrome - 2018 in setting of MI. Echo ordered.  6. DM2 - Follows with PCP. A1c 06/10/19 6.0. Congratulated on good management.  Disposition: Follow up in 6 month(s) with Dr. Agustin Cree.   Loel Dubonnet, NP 07/21/2019, 4:03 PM

## 2019-07-21 NOTE — Patient Instructions (Addendum)
Medication Instructions:  No medication changes today.  *If you need a refill on your cardiac medications before your next appointment, please call your pharmacy*  Lab Work: No lab work today.  If you have labs (blood work) drawn today and your tests are completely normal, you will receive your results only by: Marland Kitchen MyChart Message (if you have MyChart) OR . A paper copy in the mail If you have any lab test that is abnormal or we need to change your treatment, we will call you to review the results.  Testing/Procedures: Your physician has requested that you have an echocardiogram. Echocardiography is a painless test that uses sound waves to create images of your heart. It provides your doctor with information about the size and shape of your heart and how well your heart's chambers and valves are working. This procedure takes approximately one hour. There are no restrictions for this procedure.  Follow-Up: At [facility], you and your health needs are our priority.  As part of our continuing mission to provide you with exceptional heart care, we have created designated Provider Care Teams.  These Care Teams include your primary Cardiologist (physician) and Advanced Practice Providers (APPs -  Physician Assistants and Nurse Practitioners) who all work together to provide you with the care you need, when you need it.  Your next appointment:   6 months  The format for your next appointment:   In Person  Provider:   Jenne Campus, MD  Other Instructions  We will send your surgical clearance to Dr. Ian Malkin once we have the results of your echocardiogram. Anticipate result by Wednesday of next week.

## 2019-07-22 MED ORDER — MINOXIDIL 10 MG PO TABS: 10.0000 mg | ORAL_TABLET | Freq: Every day | ORAL | 1 refills | Status: DC

## 2019-07-22 MED ORDER — SIMVASTATIN 40 MG PO TABS: 40.0000 mg | ORAL_TABLET | Freq: Every day | ORAL | 1 refills | Status: DC

## 2019-07-22 MED ORDER — CLONIDINE HCL 0.1 MG PO TABS: ORAL_TABLET | ORAL | 1 refills | Status: DC

## 2019-07-22 MED ORDER — SPIRONOLACTONE 25 MG PO TABS: 25.0000 mg | ORAL_TABLET | Freq: Every day | ORAL | 1 refills | Status: DC

## 2019-07-22 MED ORDER — CLONIDINE HCL 0.2 MG PO TABS: 0.2000 mg | ORAL_TABLET | Freq: Two times a day (BID) | ORAL | 1 refills | Status: DC

## 2019-07-22 NOTE — Addendum Note (Signed)
Addended by: Polly Cobia A on: 07/22/2019 09:01 AM   Modules accepted: Orders

## 2019-07-23 DIAGNOSIS — H524 Presbyopia: Secondary | ICD-10-CM | POA: Diagnosis not present

## 2019-07-28 ENCOUNTER — Ambulatory Visit (INDEPENDENT_AMBULATORY_CARE_PROVIDER_SITE_OTHER): Payer: Medicare Other

## 2019-07-28 ENCOUNTER — Other Ambulatory Visit: Payer: Self-pay

## 2019-07-28 DIAGNOSIS — I5181 Takotsubo syndrome: Secondary | ICD-10-CM | POA: Diagnosis not present

## 2019-07-28 NOTE — Progress Notes (Signed)
Complete echocardiogram has been performed.  Jimmy Dereon Williamsen RDCS, RVT 

## 2019-07-30 ENCOUNTER — Telehealth: Payer: Self-pay

## 2019-07-30 ENCOUNTER — Telehealth: Payer: Self-pay | Admitting: Cardiology

## 2019-07-30 NOTE — Telephone Encounter (Signed)
Phoned patient, informed of echocardiogram results. Verbal understanding received, questions answered.

## 2019-07-30 NOTE — Telephone Encounter (Signed)
Pt. Returning call to [place]

## 2019-07-30 NOTE — Telephone Encounter (Signed)
Left voice message to call back for echo results.

## 2019-08-28 ENCOUNTER — Other Ambulatory Visit: Payer: Self-pay | Admitting: Cardiology

## 2019-08-28 DIAGNOSIS — J309 Allergic rhinitis, unspecified: Secondary | ICD-10-CM | POA: Diagnosis present

## 2019-08-28 DIAGNOSIS — E785 Hyperlipidemia, unspecified: Secondary | ICD-10-CM | POA: Diagnosis present

## 2019-08-28 DIAGNOSIS — Z79899 Other long term (current) drug therapy: Secondary | ICD-10-CM | POA: Diagnosis not present

## 2019-08-28 DIAGNOSIS — M1611 Unilateral primary osteoarthritis, right hip: Secondary | ICD-10-CM | POA: Diagnosis present

## 2019-08-28 DIAGNOSIS — F419 Anxiety disorder, unspecified: Secondary | ICD-10-CM | POA: Diagnosis present

## 2019-08-28 DIAGNOSIS — I252 Old myocardial infarction: Secondary | ICD-10-CM | POA: Diagnosis not present

## 2019-08-28 DIAGNOSIS — E119 Type 2 diabetes mellitus without complications: Secondary | ICD-10-CM | POA: Diagnosis present

## 2019-08-28 DIAGNOSIS — Z7984 Long term (current) use of oral hypoglycemic drugs: Secondary | ICD-10-CM | POA: Diagnosis not present

## 2019-08-28 DIAGNOSIS — I10 Essential (primary) hypertension: Secondary | ICD-10-CM | POA: Diagnosis present

## 2019-10-03 DIAGNOSIS — M1611 Unilateral primary osteoarthritis, right hip: Secondary | ICD-10-CM | POA: Diagnosis not present

## 2019-10-09 ENCOUNTER — Other Ambulatory Visit: Payer: Self-pay | Admitting: Cardiology

## 2019-10-10 DIAGNOSIS — F320 Major depressive disorder, single episode, mild: Secondary | ICD-10-CM | POA: Diagnosis not present

## 2019-10-10 DIAGNOSIS — E785 Hyperlipidemia, unspecified: Secondary | ICD-10-CM | POA: Diagnosis not present

## 2019-10-10 DIAGNOSIS — E119 Type 2 diabetes mellitus without complications: Secondary | ICD-10-CM | POA: Diagnosis not present

## 2019-10-10 DIAGNOSIS — I10 Essential (primary) hypertension: Secondary | ICD-10-CM | POA: Diagnosis not present

## 2019-10-12 ENCOUNTER — Other Ambulatory Visit: Payer: Self-pay | Admitting: Cardiology

## 2019-11-06 DIAGNOSIS — I10 Essential (primary) hypertension: Secondary | ICD-10-CM | POA: Diagnosis not present

## 2019-11-06 DIAGNOSIS — E119 Type 2 diabetes mellitus without complications: Secondary | ICD-10-CM | POA: Diagnosis not present

## 2019-11-06 DIAGNOSIS — D539 Nutritional anemia, unspecified: Secondary | ICD-10-CM | POA: Diagnosis not present

## 2019-11-06 DIAGNOSIS — F320 Major depressive disorder, single episode, mild: Secondary | ICD-10-CM | POA: Diagnosis not present

## 2019-12-01 DIAGNOSIS — Z23 Encounter for immunization: Secondary | ICD-10-CM | POA: Diagnosis not present

## 2019-12-22 DIAGNOSIS — Z23 Encounter for immunization: Secondary | ICD-10-CM | POA: Diagnosis not present

## 2019-12-31 ENCOUNTER — Other Ambulatory Visit: Payer: Self-pay | Admitting: Family

## 2020-01-09 DIAGNOSIS — E785 Hyperlipidemia, unspecified: Secondary | ICD-10-CM | POA: Diagnosis not present

## 2020-01-09 DIAGNOSIS — D539 Nutritional anemia, unspecified: Secondary | ICD-10-CM | POA: Diagnosis not present

## 2020-01-09 DIAGNOSIS — F320 Major depressive disorder, single episode, mild: Secondary | ICD-10-CM | POA: Diagnosis not present

## 2020-01-09 DIAGNOSIS — E119 Type 2 diabetes mellitus without complications: Secondary | ICD-10-CM | POA: Diagnosis not present

## 2020-01-09 DIAGNOSIS — I10 Essential (primary) hypertension: Secondary | ICD-10-CM | POA: Diagnosis not present

## 2020-01-16 ENCOUNTER — Other Ambulatory Visit: Payer: Self-pay | Admitting: Family

## 2020-01-22 ENCOUNTER — Other Ambulatory Visit: Payer: Self-pay | Admitting: Family

## 2020-02-19 DIAGNOSIS — E119 Type 2 diabetes mellitus without complications: Secondary | ICD-10-CM | POA: Diagnosis not present

## 2020-02-19 DIAGNOSIS — H25813 Combined forms of age-related cataract, bilateral: Secondary | ICD-10-CM | POA: Diagnosis not present

## 2020-02-20 DIAGNOSIS — M1611 Unilateral primary osteoarthritis, right hip: Secondary | ICD-10-CM | POA: Diagnosis not present

## 2020-02-27 ENCOUNTER — Other Ambulatory Visit: Payer: Self-pay | Admitting: Cardiology

## 2020-03-31 ENCOUNTER — Other Ambulatory Visit: Payer: Self-pay | Admitting: Family

## 2020-04-01 ENCOUNTER — Encounter: Payer: Self-pay | Admitting: Cardiology

## 2020-04-01 ENCOUNTER — Ambulatory Visit (INDEPENDENT_AMBULATORY_CARE_PROVIDER_SITE_OTHER): Payer: Medicare Other | Admitting: Cardiology

## 2020-04-01 ENCOUNTER — Other Ambulatory Visit: Payer: Self-pay

## 2020-04-01 VITALS — BP 162/78 | HR 63 | Ht 62.0 in | Wt 177.4 lb

## 2020-04-01 DIAGNOSIS — E119 Type 2 diabetes mellitus without complications: Secondary | ICD-10-CM

## 2020-04-01 DIAGNOSIS — I10 Essential (primary) hypertension: Secondary | ICD-10-CM

## 2020-04-01 DIAGNOSIS — I5181 Takotsubo syndrome: Secondary | ICD-10-CM

## 2020-04-01 DIAGNOSIS — I2510 Atherosclerotic heart disease of native coronary artery without angina pectoris: Secondary | ICD-10-CM

## 2020-04-01 DIAGNOSIS — E7800 Pure hypercholesterolemia, unspecified: Secondary | ICD-10-CM

## 2020-04-01 NOTE — Progress Notes (Signed)
Cardiology Office Note:    Date:  04/01/2020   ID:  Hayley Simon, DOB 1945-10-06, MRN [id number]  PCP:  Lowella Dandy, NP  Cardiologist:  Jenne Campus, MD    Referring MD: Lowella Dandy, NP   No chief complaint on file. I had my hip surgery done  History of Present Illness:    Hayley Simon is a 74 y.o. female with past medical history significant for remote Takotsubo cardiomyopathy with normalization, essential hypertension, coronary artery disease with cardiac catheterization done in 2018 showing mild nonobstructive CAD interesting at the time she got anterior lateral inferior wall hypokinesis with ejection fraction 35% and at that time diagnosis of atypical Takotsubo has been made likely since that time she recovered.  She also got history of dyslipidemia diabetes.  Comes today to my office for follow-up.  She was evaluated before elective hip replacement surgery.  She went to surgery with no difficulties however surgery has been complicated with some nerve damage that she still struggling with.  She does have numbness below the knee on the right side.  Denies have any cardiac complaints.  Recently her medication has been withdrawn because of blood pressure being low however now things are good and her blood pressure actually still elevated.  The plan will be I will ask her to have Chem-7 done if Chem-7 is fine we will start her back on spironolactone 12.5 daily.  Past Medical History:  Diagnosis Date  . Anxiety and depression   . Coronary artery disease   . Diabetes mellitus without complication ([place])   . Hypertension   . PVD (peripheral vascular disease) ([place])   . Stress-induced cardiomyopathy     Past Surgical History:  Procedure Laterality Date  . ABDOMINAL HYSTERECTOMY    . BREAST BIOPSY    . CARDIAC CATHETERIZATION    . CESAREAN SECTION    . TUMOR REMOVAL      Current Medications: Current Meds  Medication Sig  . ALPRAZolam (XANAX) 0.5 MG tablet Take 0.5 mg by  mouth as needed.  Marland Kitchen aspirin 81 MG tablet Take 81 mg by mouth daily.   . Calcium Carb-Ergocalciferol 250-125 MG-UNIT TABS Take 1 tablet by mouth 2 (two) times daily.  . cloNIDine (CATAPRES) 0.1 MG tablet TAKE 1 TABLET (0.1 mg)  BY MOUTH AS NEEDED (FOR SYSTOLIC BP >696).  . clotrimazole-betamethasone (LOTRISONE) cream APPLY SMALL AMOUNT CREAM TWO TIMES DAILY, AS NEEDED  . FLUoxetine (PROZAC) 20 MG capsule Take 1 capsule by mouth daily.  . furosemide (LASIX) 40 MG tablet TAKE 1 TABLET BY MOUTH EVERY DAY  . glimepiride (AMARYL) 4 MG tablet Take 1/2 tablet daily.  . hydrALAZINE (APRESOLINE) 50 MG tablet TAKE 1.5 TABLETS (75 MG TOTAL) BY MOUTH 3 (THREE) TIMES DAILY.  Marland Kitchen ibuprofen (ADVIL) 800 MG tablet Take 800 mg by mouth 2 (two) times daily as needed.  . loratadine (CLARITIN) 10 MG tablet Take 1 tablet by mouth as needed.   Marland Kitchen losartan (COZAAR) 100 MG tablet TAKE 1 TABLET BY MOUTH EVERY DAY  . metoprolol tartrate (LOPRESSOR) 50 MG tablet Take 1 tablet by mouth 2 (two) times daily.  . minoxidil (LONITEN) 10 MG tablet TAKE 1 TABLET BY MOUTH EVERY DAY  . Multiple Vitamin (MULTIVITAMIN) capsule Take 1 capsule by mouth daily.  . simvastatin (ZOCOR) 40 MG tablet TAKE 1 TABLET BY MOUTH EVERY DAY     Allergies:   Penicillins   Social History   Socioeconomic History  . Marital status: Married  Spouse name: Not on file  . Number of children: Not on file  . Years of education: Not on file  . Highest education level: Not on file  Occupational History  . Not on file  Tobacco Use  . Smoking status: Never Smoker  . Smokeless tobacco: Never Used  Vaping Use  . Vaping Use: Never used  Substance and Sexual Activity  . Alcohol use: Not Currently  . Drug use: Never  . Sexual activity: Not on file  Other Topics Concern  . Not on file  Social History Narrative  . Not on file   Social Determinants of Health   Financial Resource Strain:   . Difficulty of Paying Living Expenses:   Food Insecurity:    . Worried About Charity fundraiser in the Last Year:   . Arboriculturist in the Last Year:   Transportation Needs:   . Film/video editor (Medical):   Marland Kitchen Lack of Transportation (Non-Medical):   Physical Activity:   . Days of Exercise per Week:   . Minutes of Exercise per Session:   Stress:   . Feeling of Stress :   Social Connections:   . Frequency of Communication with Friends and Family:   . Frequency of Social Gatherings with Friends and Family:   . Attends Religious Services:   . Active Member of Clubs or Organizations:   . Attends Archivist Meetings:   Marland Kitchen Marital Status:      Family History: The patient's family history includes Healthy in her father and mother. ROS:   Please see the history of present illness.    All 14 point review of systems negative except as described per history of present illness  EKGs/Labs/Other Studies Reviewed:      Recent Labs: No results found for requested labs within last 8760 hours.  Recent Lipid Panel No results found for: CHOL, TRIG, HDL, CHOLHDL, VLDL, LDLCALC, LDLDIRECT  Physical Exam:    VS:  BP (!) 162/78 (BP Location: Left Arm, Patient Position: Sitting, Cuff Size: Normal)   Pulse 63   Ht 5\' 2"  (1.575 m)   Wt 177 lb 6.4 oz (80.5 kg)   SpO2 95%   BMI 32.45 kg/m     Wt Readings from Last 3 Encounters:  04/01/20 177 lb 6.4 oz (80.5 kg)  07/21/19 179 lb (81.2 kg)  12/04/18 178 lb (80.7 kg)     GEN:  Well nourished, well developed in no acute distress HEENT: Normal NECK: No JVD; No carotid bruits LYMPHATICS: No lymphadenopathy CARDIAC: RRR, no murmurs, no rubs, no gallops RESPIRATORY:  Clear to auscultation without rales, wheezing or rhonchi  ABDOMEN: Soft, non-tender, non-distended MUSCULOSKELETAL:  No edema; No deformity  SKIN: Warm and dry LOWER EXTREMITIES: no swelling NEUROLOGIC:  Alert and oriented x 3 PSYCHIATRIC:  Normal affect   ASSESSMENT:    1. Takotsubo syndrome   2. Essential  hypertension   3. Coronary artery disease involving native coronary artery of native heart without angina pectoris   4. Type 2 diabetes mellitus without complication, without long-term current use of insulin ([place])   5. Pure hypercholesterolemia    PLAN:    In order of problems listed above:  1. History of Takotsubo cardiomyopathy with cardiac catheterization done in 2018 showing nonobstructive mild coronary artery disease.  She is perfectly recovered from it.  Her last echocardiogram showed preserved left ventricle ejection fraction with LVH.  We will continue present management. 2. Essential hypertension again a  problem.  I will check her Chem-7 today if Chem-7 is fine we will start her back on spironolactone 12.5 daily. 3. Dyslipidemia: I did review her K PN I do have her LDL which is 75 and HDL of 44 this is from 30 April of this year.  She is on simvastatin 40 which I will continue however in the future we need to start talking about potentially augmenting this therapy.   Medication Adjustments/Labs and Tests Ordered: Current medicines are reviewed at length with the patient today.  Concerns regarding medicines are outlined above.  No orders of the defined types were placed in this encounter.  Medication changes: No orders of the defined types were placed in this encounter.   Signed, Park Liter, MD, [facility] 04/01/2020 11:56 AM    [place]

## 2020-04-01 NOTE — Patient Instructions (Signed)

## 2020-04-02 LAB — BASIC METABOLIC PANEL
BUN/Creatinine Ratio: 17 (ref 12–28)
BUN: 18 mg/dL (ref 8–27)
CO2: 26 mmol/L (ref 20–29)
Calcium: 9.8 mg/dL (ref 8.7–10.3)
Chloride: 102 mmol/L (ref 96–106)
Creatinine, Ser: 1.03 mg/dL — ABNORMAL HIGH (ref 0.57–1.00)
GFR calc Af Amer: 62 mL/min/{1.73_m2} (ref >59)
GFR calc non Af Amer: 54 mL/min/{1.73_m2} — ABNORMAL LOW (ref >59)
Glucose: 94 mg/dL (ref 65–99)
Potassium: 4 mmol/L (ref 3.5–5.2)
Sodium: 142 mmol/L (ref 134–144)

## 2020-04-05 ENCOUNTER — Telehealth: Payer: Self-pay | Admitting: Emergency Medicine

## 2020-04-05 DIAGNOSIS — Z79899 Other long term (current) drug therapy: Secondary | ICD-10-CM

## 2020-04-05 MED ORDER — SPIRONOLACTONE 25 MG PO TABS
12.5000 mg | ORAL_TABLET | Freq: Every day | ORAL | 2 refills | Status: DC
Start: 2020-04-05 — End: 2020-09-09

## 2020-04-05 NOTE — Telephone Encounter (Signed)
Called patient. Informed her of lab results and that Dr. Agustin Cree advised her to start spironolactone 12.5 mg daily and have labs checked in 1 week. Patient is going to have pcp check her labs this Friday and she will bring by here after. No further questions.

## 2020-04-09 ENCOUNTER — Other Ambulatory Visit: Payer: Self-pay | Admitting: Family

## 2020-04-09 DIAGNOSIS — F320 Major depressive disorder, single episode, mild: Secondary | ICD-10-CM | POA: Diagnosis not present

## 2020-04-09 DIAGNOSIS — E119 Type 2 diabetes mellitus without complications: Secondary | ICD-10-CM | POA: Diagnosis not present

## 2020-04-09 DIAGNOSIS — E785 Hyperlipidemia, unspecified: Secondary | ICD-10-CM | POA: Diagnosis not present

## 2020-04-09 DIAGNOSIS — I10 Essential (primary) hypertension: Secondary | ICD-10-CM | POA: Diagnosis not present

## 2020-04-09 DIAGNOSIS — G5721 Lesion of femoral nerve, right lower limb: Secondary | ICD-10-CM | POA: Diagnosis not present

## 2020-04-09 DIAGNOSIS — Z6831 Body mass index (BMI) 31.0-31.9, adult: Secondary | ICD-10-CM | POA: Diagnosis not present

## 2020-04-09 DIAGNOSIS — D539 Nutritional anemia, unspecified: Secondary | ICD-10-CM | POA: Diagnosis not present

## 2020-05-20 ENCOUNTER — Other Ambulatory Visit: Payer: Self-pay | Admitting: Cardiology

## 2020-05-28 DIAGNOSIS — Z01818 Encounter for other preprocedural examination: Secondary | ICD-10-CM | POA: Diagnosis not present

## 2020-05-28 DIAGNOSIS — E119 Type 2 diabetes mellitus without complications: Secondary | ICD-10-CM | POA: Diagnosis not present

## 2020-05-28 DIAGNOSIS — H25811 Combined forms of age-related cataract, right eye: Secondary | ICD-10-CM | POA: Diagnosis not present

## 2020-06-02 ENCOUNTER — Other Ambulatory Visit: Payer: Self-pay | Admitting: Family

## 2020-06-08 DIAGNOSIS — Z96641 Presence of right artificial hip joint: Secondary | ICD-10-CM | POA: Diagnosis not present

## 2020-06-08 DIAGNOSIS — M1610 Unilateral primary osteoarthritis, unspecified hip: Secondary | ICD-10-CM | POA: Diagnosis not present

## 2020-06-22 DIAGNOSIS — I10 Essential (primary) hypertension: Secondary | ICD-10-CM | POA: Diagnosis not present

## 2020-06-22 DIAGNOSIS — Z881 Allergy status to other antibiotic agents status: Secondary | ICD-10-CM | POA: Diagnosis not present

## 2020-06-22 DIAGNOSIS — H259 Unspecified age-related cataract: Secondary | ICD-10-CM | POA: Diagnosis not present

## 2020-06-22 DIAGNOSIS — Z6831 Body mass index (BMI) 31.0-31.9, adult: Secondary | ICD-10-CM | POA: Diagnosis not present

## 2020-06-22 DIAGNOSIS — E785 Hyperlipidemia, unspecified: Secondary | ICD-10-CM | POA: Diagnosis not present

## 2020-06-22 DIAGNOSIS — Z7982 Long term (current) use of aspirin: Secondary | ICD-10-CM | POA: Diagnosis not present

## 2020-06-22 DIAGNOSIS — Z7984 Long term (current) use of oral hypoglycemic drugs: Secondary | ICD-10-CM | POA: Diagnosis not present

## 2020-06-22 DIAGNOSIS — I252 Old myocardial infarction: Secondary | ICD-10-CM | POA: Diagnosis not present

## 2020-06-22 DIAGNOSIS — E1136 Type 2 diabetes mellitus with diabetic cataract: Secondary | ICD-10-CM | POA: Diagnosis not present

## 2020-06-22 DIAGNOSIS — H25811 Combined forms of age-related cataract, right eye: Secondary | ICD-10-CM | POA: Diagnosis not present

## 2020-06-22 DIAGNOSIS — E669 Obesity, unspecified: Secondary | ICD-10-CM | POA: Diagnosis not present

## 2020-06-22 DIAGNOSIS — F419 Anxiety disorder, unspecified: Secondary | ICD-10-CM | POA: Diagnosis not present

## 2020-06-22 DIAGNOSIS — F32A Depression, unspecified: Secondary | ICD-10-CM | POA: Diagnosis not present

## 2020-07-02 ENCOUNTER — Other Ambulatory Visit: Payer: Self-pay

## 2020-07-02 ENCOUNTER — Encounter: Payer: Self-pay | Admitting: Cardiology

## 2020-07-02 ENCOUNTER — Ambulatory Visit (INDEPENDENT_AMBULATORY_CARE_PROVIDER_SITE_OTHER): Payer: Medicare Other | Admitting: Cardiology

## 2020-07-02 VITALS — BP 120/60 | HR 60 | Ht 62.0 in | Wt 179.0 lb

## 2020-07-02 DIAGNOSIS — I10 Essential (primary) hypertension: Secondary | ICD-10-CM | POA: Diagnosis not present

## 2020-07-02 DIAGNOSIS — E7800 Pure hypercholesterolemia, unspecified: Secondary | ICD-10-CM | POA: Diagnosis not present

## 2020-07-02 DIAGNOSIS — R079 Chest pain, unspecified: Secondary | ICD-10-CM

## 2020-07-02 DIAGNOSIS — R0789 Other chest pain: Secondary | ICD-10-CM

## 2020-07-02 DIAGNOSIS — I2510 Atherosclerotic heart disease of native coronary artery without angina pectoris: Secondary | ICD-10-CM | POA: Diagnosis not present

## 2020-07-02 DIAGNOSIS — I5181 Takotsubo syndrome: Secondary | ICD-10-CM | POA: Diagnosis not present

## 2020-07-02 HISTORY — DX: Other chest pain: R07.89

## 2020-07-02 NOTE — Patient Instructions (Addendum)
Medication Instructions:  Your physician recommends that you continue on your current medications as directed. Please refer to the Current Medication list given to you today.  *If you need a refill on your cardiac medications before your next appointment, please call your pharmacy*   Lab Work: None ordered If you have labs (blood work) drawn today and your tests are completely normal, you will receive your results only by: Marland Kitchen MyChart Message (if you have MyChart) OR . A paper copy in the mail If you have any lab test that is abnormal or we need to change your treatment, we will call you to review the results.   Testing/Procedures: Your physician has requested that you have a lexiscan myoview. For further information please visit [email or web address]. Please follow instructions below.   Follow-Up: At [facility], you and your health needs are our priority.  As part of our continuing mission to provide you with exceptional heart care, we have created designated Provider Care Teams.  These Care Teams include your primary Cardiologist (physician) and Advanced Practice Providers (APPs -  Physician Assistants and Nurse Practitioners) who all work together to provide you with the care you need, when you need it.  We recommend signing up for the patient portal called "MyChart".  Sign up information is provided on this After Visit Summary.  MyChart is used to connect with patients for Virtual Visits (Telemedicine).  Patients are able to view lab/test results, encounter notes, upcoming appointments, etc.  Non-urgent messages can be sent to your provider as well.   To learn more about what you can do with MyChart, go to [email or web address].    Your next appointment:   6 month(s)  The format for your next appointment:   In Person  Provider:   Jenne Campus, MD    Thank you for choosing [facility]!!     Other Instructions  Your physician has requested that you have a lexiscan  myoview. For further information please visit [email or web address].   How to Prepare for Your Myoview Test:        A. Nothing to eat or drink 2 hours prior to arrival time, except you may drink water       B. No Caffeine/Decaffeinated products or chocolate 12 hours prior to arrival.       C. No Cologne or Lotion       D. Wear comfortable walking shoes       E. Total time is 3 to 4 hours; you may want to bring reading material for the waiting time.   After You Arrive:  Once you arrive in the Nuclear Cardiology lab, an IV will be started, then the Technologist will inject a small amount of radioactive tracer. There will be a 1 hour waiting period after this injection. A series of pictures will be taken of your heart following this waiting period. You will be prepped for the stress portion of the test. During the stress portion of your test a small amount of radioactive tracer will be injected in your IV. After the stress portion, there is a short rest period during which time your heart and blood pressure will be monitored. After the short test period the Technologist will begin your second set of pictures. Your doctor will inform you of your test results within 7 days.

## 2020-07-02 NOTE — Progress Notes (Signed)
Cardiology Office Note:    Date:  07/02/2020   ID:  Hayley Simon, DOB 20-Jul-1946, MRN [id number]  PCP:  Lowella Dandy, NP  Cardiologist:  Jenne Campus, MD    Referring MD: Lowella Dandy, NP   Chief Complaint  Patient presents with  . Follow-up  Doing well but have some chest pain  History of Present Illness:    Hayley Simon is a 74 y.o. female with past medical history significant for coronary artery disease, cardiac catheterization done in 2018 showed only luminal, nonobstructive CAD.  That was done in face of cardiomyopathy with ejection fraction 35%.  Diagnosis of Takotsubo has been made at that time.  She was put on appropriate medication she completely recovered.  Last echocardiogram done about a year ago showed preserved left ventricle ejection fraction.  She also have history of hypertension which was difficult to control but now seems to be under control.  Dyslipidemia, diabetes.  She comes today to my office initially she tells me she is doing well.  She did have cardiac surgery done a few weeks ago and she is very happy with that.  However then she start talking to me about chest pain that she gets she described a sensation like tightness that happened in different situations that can happen when she is sitting up and when she watch TV can also happen when she gets upset.  She said this is somewhat similar to the sensation that she got in 2018 when she developed problem.  Past Medical History:  Diagnosis Date  . Anxiety and depression   . Coronary artery disease   . Diabetes mellitus without complication ([place])   . Hypertension   . PVD (peripheral vascular disease) ([place])   . Stress-induced cardiomyopathy     Past Surgical History:  Procedure Laterality Date  . ABDOMINAL HYSTERECTOMY    . BREAST BIOPSY    . CARDIAC CATHETERIZATION    . CESAREAN SECTION    . TUMOR REMOVAL      Current Medications: Current Meds  Medication Sig  . ALPRAZolam (XANAX) 0.5 MG tablet  Take 0.5 mg by mouth as needed.  Marland Kitchen aspirin 81 MG tablet Take 81 mg by mouth daily.   . Calcium Carb-Ergocalciferol 250-125 MG-UNIT TABS Take 1 tablet by mouth 2 (two) times daily.  . cloNIDine (CATAPRES) 0.1 MG tablet TAKE 1 TABLET (0.1 mg)  BY MOUTH AS NEEDED (FOR SYSTOLIC BP >389).  . clotrimazole-betamethasone (LOTRISONE) cream APPLY SMALL AMOUNT CREAM TWO TIMES DAILY, AS NEEDED  . FLUoxetine (PROZAC) 20 MG capsule Take 1 capsule by mouth daily.  . furosemide (LASIX) 40 MG tablet TAKE 1 TABLET BY MOUTH EVERY DAY  . glimepiride (AMARYL) 4 MG tablet Take 1/2 tablet daily.  . hydrALAZINE (APRESOLINE) 50 MG tablet TAKE 1.5 TABLETS (75 MG TOTAL) BY MOUTH 3 (THREE) TIMES DAILY.  Marland Kitchen ibuprofen (ADVIL) 800 MG tablet Take 800 mg by mouth 2 (two) times daily as needed.  . loratadine (CLARITIN) 10 MG tablet Take 1 tablet by mouth as needed.   Marland Kitchen losartan (COZAAR) 100 MG tablet TAKE 1 TABLET BY MOUTH EVERY DAY  . metoprolol tartrate (LOPRESSOR) 50 MG tablet Take 1 tablet by mouth 2 (two) times daily.  . minoxidil (LONITEN) 10 MG tablet TAKE 1 TABLET BY MOUTH EVERY DAY  . Multiple Vitamin (MULTIVITAMIN) capsule Take 1 capsule by mouth daily.  . simvastatin (ZOCOR) 40 MG tablet TAKE 1 TABLET BY MOUTH EVERY DAY  . spironolactone (ALDACTONE) 25 MG tablet  Take 0.5 tablets (12.5 mg total) by mouth daily.     Allergies:   Penicillins   Social History   Socioeconomic History  . Marital status: Married    Spouse name: Not on file  . Number of children: Not on file  . Years of education: Not on file  . Highest education level: Not on file  Occupational History  . Not on file  Tobacco Use  . Smoking status: Never Smoker  . Smokeless tobacco: Never Used  Vaping Use  . Vaping Use: Never used  Substance and Sexual Activity  . Alcohol use: Not Currently  . Drug use: Never  . Sexual activity: Not on file  Other Topics Concern  . Not on file  Social History Narrative  . Not on file   Social  Determinants of Health   Financial Resource Strain:   . Difficulty of Paying Living Expenses: Not on file  Food Insecurity:   . Worried About Charity fundraiser in the Last Year: Not on file  . Ran Out of Food in the Last Year: Not on file  Transportation Needs:   . Lack of Transportation (Medical): Not on file  . Lack of Transportation (Non-Medical): Not on file  Physical Activity:   . Days of Exercise per Week: Not on file  . Minutes of Exercise per Session: Not on file  Stress:   . Feeling of Stress : Not on file  Social Connections:   . Frequency of Communication with Friends and Family: Not on file  . Frequency of Social Gatherings with Friends and Family: Not on file  . Attends Religious Services: Not on file  . Active Member of Clubs or Organizations: Not on file  . Attends Archivist Meetings: Not on file  . Marital Status: Not on file     Family History: The patient's family history includes Healthy in her father and mother. ROS:   Please see the history of present illness.    All 14 point review of systems negative except as described per history of present illness  EKGs/Labs/Other Studies Reviewed:      Recent Labs: 04/01/2020: BUN 18; Creatinine, Ser 1.03; Potassium 4.0; Sodium 142  Recent Lipid Panel No results found for: CHOL, TRIG, HDL, CHOLHDL, VLDL, LDLCALC, LDLDIRECT  Physical Exam:    VS:  BP 120/60 (BP Location: Right Arm, Patient Position: Sitting, Cuff Size: Normal)   Pulse 60   Ht 5\' 2"  (1.575 m)   Wt 179 lb (81.2 kg)   SpO2 96%   BMI 32.74 kg/m     Wt Readings from Last 3 Encounters:  07/02/20 179 lb (81.2 kg)  04/01/20 177 lb 6.4 oz (80.5 kg)  07/21/19 179 lb (81.2 kg)     GEN:  Well nourished, well developed in no acute distress HEENT: Normal NECK: No JVD; No carotid bruits LYMPHATICS: No lymphadenopathy CARDIAC: RRR, no murmurs, no rubs, no gallops RESPIRATORY:  Clear to auscultation without rales, wheezing or rhonchi    ABDOMEN: Soft, non-tender, non-distended MUSCULOSKELETAL:  No edema; No deformity  SKIN: Warm and dry LOWER EXTREMITIES: no swelling NEUROLOGIC:  Alert and oriented x 3 PSYCHIATRIC:  Normal affect   ASSESSMENT:    1. Coronary artery disease involving native coronary artery of native heart without angina pectoris   2. Essential hypertension   3. Takotsubo syndrome   4. Pure hypercholesterolemia   5. Atypical chest pain    PLAN:    In order of problems listed  above:  1. Coronary disease only luminal based on cardiac cath from 2019.  She does have some symptoms somewhat concerning.  She is already on aspirin and statin which I will continue.  I asked her to have a Lexiscan done. 2. Essential hypertension: Blood pressure perfectly controlled continue present management. 3. Takotsubo history of. 4. Last echocardiogram showed preserved ejection fraction. 5. Dyslipidemia: She is scheduled to have her fasting blood profile done by primary care physician she is taking simvastatin 40 mg daily.   Medication Adjustments/Labs and Tests Ordered: Current medicines are reviewed at length with the patient today.  Concerns regarding medicines are outlined above.  No orders of the defined types were placed in this encounter.  Medication changes: No orders of the defined types were placed in this encounter.   Signed, Park Liter, MD, [facility] 07/02/2020 4:27 PM    [place]

## 2020-07-07 ENCOUNTER — Telehealth: Payer: Self-pay

## 2020-07-07 NOTE — Telephone Encounter (Signed)
Spoke with the patient, detailed instructions given. She stated that she understood and would be here for her test. Asked to call back with any questions. S.Madysen Faircloth EMTP 

## 2020-07-12 ENCOUNTER — Telehealth: Payer: Self-pay | Admitting: *Deleted

## 2020-07-12 DIAGNOSIS — E119 Type 2 diabetes mellitus without complications: Secondary | ICD-10-CM | POA: Diagnosis not present

## 2020-07-12 DIAGNOSIS — Z6832 Body mass index (BMI) 32.0-32.9, adult: Secondary | ICD-10-CM | POA: Diagnosis not present

## 2020-07-12 DIAGNOSIS — G5721 Lesion of femoral nerve, right lower limb: Secondary | ICD-10-CM | POA: Diagnosis not present

## 2020-07-12 DIAGNOSIS — F320 Major depressive disorder, single episode, mild: Secondary | ICD-10-CM | POA: Diagnosis not present

## 2020-07-12 DIAGNOSIS — I10 Essential (primary) hypertension: Secondary | ICD-10-CM | POA: Diagnosis not present

## 2020-07-12 DIAGNOSIS — E785 Hyperlipidemia, unspecified: Secondary | ICD-10-CM | POA: Diagnosis not present

## 2020-07-12 DIAGNOSIS — D539 Nutritional anemia, unspecified: Secondary | ICD-10-CM | POA: Diagnosis not present

## 2020-07-12 MED ORDER — HYDRALAZINE HCL 50 MG PO TABS
75.0000 mg | ORAL_TABLET | Freq: Three times a day (TID) | ORAL | 0 refills | Status: DC
Start: 2020-07-12 — End: 2020-09-07

## 2020-07-12 NOTE — Telephone Encounter (Signed)
Rx refill sent to pharmacy. 

## 2020-07-14 ENCOUNTER — Other Ambulatory Visit: Payer: Self-pay

## 2020-07-14 ENCOUNTER — Ambulatory Visit (INDEPENDENT_AMBULATORY_CARE_PROVIDER_SITE_OTHER): Payer: Medicare Other

## 2020-07-14 DIAGNOSIS — R079 Chest pain, unspecified: Secondary | ICD-10-CM | POA: Diagnosis not present

## 2020-07-14 LAB — MYOCARDIAL PERFUSION IMAGING
LV dias vol: 93 mL (ref 46–106)
LV sys vol: 20 mL
Peak HR: 79 {beats}/min
Rest HR: 57 {beats}/min
SDS: 3
SRS: 0
SSS: 3
TID: 1.01

## 2020-07-14 MED ORDER — TECHNETIUM TC 99M TETROFOSMIN IV KIT
10.7000 | PACK | Freq: Once | INTRAVENOUS | Status: AC | PRN
  Administered 2020-07-14: 10.7 via INTRAVENOUS

## 2020-07-14 MED ORDER — REGADENOSON 0.4 MG/5ML IV SOLN
0.4000 mg | Freq: Once | INTRAVENOUS | Status: AC
  Administered 2020-07-14: 0.4 mg via INTRAVENOUS

## 2020-07-14 MED ORDER — TECHNETIUM TC 99M TETROFOSMIN IV KIT
32.3000 | PACK | Freq: Once | INTRAVENOUS | Status: AC | PRN
  Administered 2020-07-14: 32.3 via INTRAVENOUS

## 2020-07-20 DIAGNOSIS — H259 Unspecified age-related cataract: Secondary | ICD-10-CM | POA: Diagnosis not present

## 2020-07-20 DIAGNOSIS — E669 Obesity, unspecified: Secondary | ICD-10-CM | POA: Diagnosis not present

## 2020-07-20 DIAGNOSIS — E785 Hyperlipidemia, unspecified: Secondary | ICD-10-CM | POA: Diagnosis not present

## 2020-07-20 DIAGNOSIS — Z7982 Long term (current) use of aspirin: Secondary | ICD-10-CM | POA: Diagnosis not present

## 2020-07-20 DIAGNOSIS — Z79899 Other long term (current) drug therapy: Secondary | ICD-10-CM | POA: Diagnosis not present

## 2020-07-20 DIAGNOSIS — J302 Other seasonal allergic rhinitis: Secondary | ICD-10-CM | POA: Diagnosis not present

## 2020-07-20 DIAGNOSIS — M1990 Unspecified osteoarthritis, unspecified site: Secondary | ICD-10-CM | POA: Diagnosis not present

## 2020-07-20 DIAGNOSIS — I10 Essential (primary) hypertension: Secondary | ICD-10-CM | POA: Diagnosis not present

## 2020-07-20 DIAGNOSIS — H25812 Combined forms of age-related cataract, left eye: Secondary | ICD-10-CM | POA: Diagnosis not present

## 2020-07-20 DIAGNOSIS — I252 Old myocardial infarction: Secondary | ICD-10-CM | POA: Diagnosis not present

## 2020-07-20 DIAGNOSIS — E1136 Type 2 diabetes mellitus with diabetic cataract: Secondary | ICD-10-CM | POA: Diagnosis not present

## 2020-07-26 ENCOUNTER — Telehealth: Payer: Self-pay

## 2020-07-26 MED ORDER — MINOXIDIL 10 MG PO TABS
10.0000 mg | ORAL_TABLET | Freq: Every day | ORAL | 3 refills | Status: DC
Start: 2020-07-26 — End: 2021-05-17

## 2020-07-26 NOTE — Telephone Encounter (Signed)
Refill sent to pharmacy.   

## 2020-08-26 ENCOUNTER — Other Ambulatory Visit: Payer: Self-pay | Admitting: Cardiology

## 2020-09-07 ENCOUNTER — Other Ambulatory Visit: Payer: Self-pay | Admitting: Family

## 2020-09-07 ENCOUNTER — Other Ambulatory Visit: Payer: Self-pay | Admitting: Cardiology

## 2020-09-07 NOTE — Telephone Encounter (Signed)
Refill request

## 2020-09-07 NOTE — Telephone Encounter (Signed)
Refill sent to pharmacy.   

## 2020-09-09 ENCOUNTER — Other Ambulatory Visit: Payer: Self-pay | Admitting: Cardiology

## 2020-09-15 DIAGNOSIS — Z9181 History of falling: Secondary | ICD-10-CM | POA: Diagnosis not present

## 2020-09-15 DIAGNOSIS — Z139 Encounter for screening, unspecified: Secondary | ICD-10-CM | POA: Diagnosis not present

## 2020-09-15 DIAGNOSIS — E669 Obesity, unspecified: Secondary | ICD-10-CM | POA: Diagnosis not present

## 2020-09-15 DIAGNOSIS — Z0000 Encounter for general adult medical examination without abnormal findings: Secondary | ICD-10-CM | POA: Diagnosis not present

## 2020-09-15 DIAGNOSIS — Z1331 Encounter for screening for depression: Secondary | ICD-10-CM | POA: Diagnosis not present

## 2020-09-15 DIAGNOSIS — E785 Hyperlipidemia, unspecified: Secondary | ICD-10-CM | POA: Diagnosis not present

## 2020-10-14 DIAGNOSIS — E785 Hyperlipidemia, unspecified: Secondary | ICD-10-CM | POA: Diagnosis not present

## 2020-10-14 DIAGNOSIS — D539 Nutritional anemia, unspecified: Secondary | ICD-10-CM | POA: Diagnosis not present

## 2020-10-14 DIAGNOSIS — G5721 Lesion of femoral nerve, right lower limb: Secondary | ICD-10-CM | POA: Diagnosis not present

## 2020-10-14 DIAGNOSIS — F320 Major depressive disorder, single episode, mild: Secondary | ICD-10-CM | POA: Diagnosis not present

## 2020-10-14 DIAGNOSIS — E1165 Type 2 diabetes mellitus with hyperglycemia: Secondary | ICD-10-CM | POA: Diagnosis not present

## 2020-10-14 DIAGNOSIS — Z6833 Body mass index (BMI) 33.0-33.9, adult: Secondary | ICD-10-CM | POA: Diagnosis not present

## 2020-10-14 DIAGNOSIS — E119 Type 2 diabetes mellitus without complications: Secondary | ICD-10-CM | POA: Diagnosis not present

## 2020-10-14 DIAGNOSIS — I10 Essential (primary) hypertension: Secondary | ICD-10-CM | POA: Diagnosis not present

## 2020-11-20 ENCOUNTER — Other Ambulatory Visit: Payer: Self-pay | Admitting: Cardiology

## 2020-11-22 NOTE — Telephone Encounter (Signed)
Simvastatin and Hydralazine approved and sent.

## 2020-12-21 ENCOUNTER — Other Ambulatory Visit: Payer: Self-pay | Admitting: Family

## 2021-01-03 ENCOUNTER — Ambulatory Visit: Payer: Medicare Other | Admitting: Cardiology

## 2021-01-05 DIAGNOSIS — F32A Depression, unspecified: Secondary | ICD-10-CM | POA: Insufficient documentation

## 2021-01-05 DIAGNOSIS — I10 Essential (primary) hypertension: Secondary | ICD-10-CM | POA: Insufficient documentation

## 2021-01-05 DIAGNOSIS — I739 Peripheral vascular disease, unspecified: Secondary | ICD-10-CM | POA: Insufficient documentation

## 2021-01-05 DIAGNOSIS — I2510 Atherosclerotic heart disease of native coronary artery without angina pectoris: Secondary | ICD-10-CM | POA: Insufficient documentation

## 2021-01-05 DIAGNOSIS — I5181 Takotsubo syndrome: Secondary | ICD-10-CM | POA: Insufficient documentation

## 2021-01-05 DIAGNOSIS — E119 Type 2 diabetes mellitus without complications: Secondary | ICD-10-CM | POA: Insufficient documentation

## 2021-01-05 DIAGNOSIS — F419 Anxiety disorder, unspecified: Secondary | ICD-10-CM | POA: Insufficient documentation

## 2021-01-07 ENCOUNTER — Encounter: Payer: Self-pay | Admitting: Cardiology

## 2021-01-07 ENCOUNTER — Ambulatory Visit (INDEPENDENT_AMBULATORY_CARE_PROVIDER_SITE_OTHER): Payer: Medicare Other | Admitting: Cardiology

## 2021-01-07 ENCOUNTER — Other Ambulatory Visit: Payer: Self-pay

## 2021-01-07 VITALS — BP 142/64 | HR 61 | Ht 62.0 in | Wt 186.0 lb

## 2021-01-07 DIAGNOSIS — I2510 Atherosclerotic heart disease of native coronary artery without angina pectoris: Secondary | ICD-10-CM

## 2021-01-07 NOTE — Patient Instructions (Signed)

## 2021-01-11 DIAGNOSIS — F320 Major depressive disorder, single episode, mild: Secondary | ICD-10-CM | POA: Diagnosis not present

## 2021-01-11 DIAGNOSIS — E785 Hyperlipidemia, unspecified: Secondary | ICD-10-CM | POA: Diagnosis not present

## 2021-01-11 DIAGNOSIS — I10 Essential (primary) hypertension: Secondary | ICD-10-CM | POA: Diagnosis not present

## 2021-01-11 DIAGNOSIS — E1165 Type 2 diabetes mellitus with hyperglycemia: Secondary | ICD-10-CM | POA: Diagnosis not present

## 2021-01-11 DIAGNOSIS — D539 Nutritional anemia, unspecified: Secondary | ICD-10-CM | POA: Diagnosis not present

## 2021-01-11 DIAGNOSIS — Z6833 Body mass index (BMI) 33.0-33.9, adult: Secondary | ICD-10-CM | POA: Diagnosis not present

## 2021-01-13 ENCOUNTER — Other Ambulatory Visit: Payer: Self-pay | Admitting: Cardiology

## 2021-01-14 NOTE — Telephone Encounter (Signed)
Losartan 100 mg # 90 x 3 refills sent to pharmacy

## 2021-02-12 ENCOUNTER — Other Ambulatory Visit: Payer: Self-pay | Admitting: Cardiology

## 2021-03-13 ENCOUNTER — Other Ambulatory Visit: Payer: Self-pay | Admitting: Cardiology

## 2021-03-16 NOTE — Telephone Encounter (Signed)
Hydralazine 50 mg # 405 tablets sent to [id number] - [place], [address] [address]

## 2021-04-06 ENCOUNTER — Other Ambulatory Visit: Payer: Self-pay | Admitting: Cardiology

## 2021-04-06 NOTE — Telephone Encounter (Signed)
Spironolactone 25 mg # 45 x 2 refills sent to  [id number]- [place], [place] - [address]

## 2021-04-18 DIAGNOSIS — F320 Major depressive disorder, single episode, mild: Secondary | ICD-10-CM | POA: Diagnosis not present

## 2021-04-18 DIAGNOSIS — Z1231 Encounter for screening mammogram for malignant neoplasm of breast: Secondary | ICD-10-CM | POA: Diagnosis not present

## 2021-04-18 DIAGNOSIS — D539 Nutritional anemia, unspecified: Secondary | ICD-10-CM | POA: Diagnosis not present

## 2021-04-18 DIAGNOSIS — B372 Candidiasis of skin and nail: Secondary | ICD-10-CM | POA: Diagnosis not present

## 2021-04-18 DIAGNOSIS — F5104 Psychophysiologic insomnia: Secondary | ICD-10-CM | POA: Diagnosis not present

## 2021-04-18 DIAGNOSIS — Z6832 Body mass index (BMI) 32.0-32.9, adult: Secondary | ICD-10-CM | POA: Diagnosis not present

## 2021-04-18 DIAGNOSIS — E785 Hyperlipidemia, unspecified: Secondary | ICD-10-CM | POA: Diagnosis not present

## 2021-04-18 DIAGNOSIS — I10 Essential (primary) hypertension: Secondary | ICD-10-CM | POA: Diagnosis not present

## 2021-04-18 DIAGNOSIS — E1165 Type 2 diabetes mellitus with hyperglycemia: Secondary | ICD-10-CM | POA: Diagnosis not present

## 2021-05-05 DIAGNOSIS — Z1231 Encounter for screening mammogram for malignant neoplasm of breast: Secondary | ICD-10-CM | POA: Diagnosis not present

## 2021-05-17 ENCOUNTER — Other Ambulatory Visit: Payer: Self-pay | Admitting: Cardiology

## 2021-07-19 ENCOUNTER — Encounter: Payer: Self-pay | Admitting: Cardiology

## 2021-07-19 ENCOUNTER — Ambulatory Visit (INDEPENDENT_AMBULATORY_CARE_PROVIDER_SITE_OTHER): Payer: Medicare Other | Admitting: Cardiology

## 2021-07-19 ENCOUNTER — Other Ambulatory Visit: Payer: Self-pay

## 2021-07-19 VITALS — BP 126/74 | HR 77 | Ht 62.0 in | Wt 181.0 lb

## 2021-07-19 DIAGNOSIS — I10 Essential (primary) hypertension: Secondary | ICD-10-CM

## 2021-07-19 DIAGNOSIS — Z6832 Body mass index (BMI) 32.0-32.9, adult: Secondary | ICD-10-CM | POA: Diagnosis not present

## 2021-07-19 DIAGNOSIS — E1165 Type 2 diabetes mellitus with hyperglycemia: Secondary | ICD-10-CM | POA: Diagnosis not present

## 2021-07-19 DIAGNOSIS — R0789 Other chest pain: Secondary | ICD-10-CM | POA: Diagnosis not present

## 2021-07-19 DIAGNOSIS — J309 Allergic rhinitis, unspecified: Secondary | ICD-10-CM | POA: Diagnosis not present

## 2021-07-19 DIAGNOSIS — F5104 Psychophysiologic insomnia: Secondary | ICD-10-CM | POA: Diagnosis not present

## 2021-07-19 DIAGNOSIS — F320 Major depressive disorder, single episode, mild: Secondary | ICD-10-CM | POA: Diagnosis not present

## 2021-07-19 DIAGNOSIS — M1990 Unspecified osteoarthritis, unspecified site: Secondary | ICD-10-CM | POA: Diagnosis not present

## 2021-07-19 DIAGNOSIS — E785 Hyperlipidemia, unspecified: Secondary | ICD-10-CM | POA: Diagnosis not present

## 2021-07-19 DIAGNOSIS — D539 Nutritional anemia, unspecified: Secondary | ICD-10-CM | POA: Diagnosis not present

## 2021-07-19 DIAGNOSIS — E119 Type 2 diabetes mellitus without complications: Secondary | ICD-10-CM

## 2021-07-19 NOTE — Progress Notes (Signed)
Cardiology Office Note:    Date:  07/19/2021   ID:  Hayley Simon, DOB September 06, 1946, MRN [id number]  PCP:  Lowella Dandy, NP  Cardiologist:  Jenne Campus, MD    Referring MD: Lowella Dandy, NP   Chief Complaint  Patient presents with   Follow-up  I am doing fine  History of Present Illness:    Hayley Simon is a 75 y.o. female with past medical history significant for coronary artery disease but only luminal disease based on cardiac catheterization from 2018.  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  She completely recovered latest echocardiogram showed preserved left ventricular ejection fraction.  She also blood pressure which is difficult to control, diabetes, dyslipidemia.  She comes today 2 months for follow-up overall doing well.  She denies have any chest pain tightness squeezing pressure burning chest no palpitation dizziness swelling of lower extremities.  Past Medical History:  Diagnosis Date   Anxiety and depression    Atypical chest pain 07/02/2020   Coronary artery disease    Coronary artery disease involving native coronary artery of native heart without angina pectoris 03/19/2017   Cardiac catheterization - 02/2017 Diagnostic Procedure Summary 1. Mild non-obstructive CAD 2. Anterior/lateral/inferior wall hypokinesis, LVEF 35%. ? 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   aspirin 81 MG tablet Take 81 mg by mouth daily.    Calcium Carb-Ergocalciferol 250-125 MG-UNIT TABS Take 1 tablet by mouth 2 (two) times daily.   cloNIDine (CATAPRES) 0.1 MG tablet TAKE 1 TABLET (0.1 MG) BY MOUTH AS NEEDED (FOR SYSTOLIC BP >976). (Patient taking differently: Take 0.1 mg by mouth as needed ((FOR SYSTOLIC BP >734).). TAKE 1 TABLET (0.1 mg)  BY MOUTH AS NEEDED (FOR SYSTOLIC BP >193).)   clotrimazole-betamethasone (LOTRISONE) cream Apply 1 application topically daily.   FLUoxetine (PROZAC) 20 MG capsule Take 1 capsule by mouth daily.   furosemide (LASIX) 40 MG tablet TAKE 1 TABLET BY MOUTH EVERY DAY (Patient taking differently: Take 40 mg by mouth daily.)   glimepiride (AMARYL) 4 MG tablet Take 4 mg by mouth daily with breakfast. Take 1/2 tablet daily.   hydrALAZINE (APRESOLINE) 50 MG tablet TAKE 1.5 TABLETS (75 MG) BY MOUTH 3 TIMES DAILY. (Patient taking differently: Take 75 mg by mouth 3 (three) times daily.)   ibuprofen (ADVIL) 800 MG tablet Take 800 mg by mouth 2 (two) times daily as needed for mild pain or moderate pain (Arthritis).   loratadine (CLARITIN) 10 MG tablet Take 1 tablet by mouth as needed for allergies.   losartan (COZAAR) 100 MG tablet Take  1 tablet (100 mg total) by mouth daily.   metFORMIN (GLUCOPHAGE) 500 MG tablet Take 500 mg by mouth 2 (two) times daily.   metoprolol tartrate (LOPRESSOR) 50 MG tablet Take 1 tablet by mouth 2 (two) times daily.   minoxidil (LONITEN) 10 MG tablet TAKE 1 TABLET BY MOUTH EVERY DAY (Patient taking differently: Take 10 mg by mouth daily.)   Multiple Vitamin (MULTIVITAMIN) capsule Take 1 capsule by mouth daily. Unknown strength   simvastatin (ZOCOR) 40 MG tablet TAKE 1  TABLET BY MOUTH EVERY DAY (Patient taking differently: Take 40 mg by mouth daily.)   spironolactone (ALDACTONE) 25 MG tablet TAKE 1/2 TABLET BY MOUTH EVERY DAY (Patient taking differently: Take 12.5 mg by mouth daily.)     Allergies:   Penicillins   Social History   Socioeconomic History   Marital status: Widowed    Spouse name: Not on file   Number of children: Not on file   Years of education: Not on file   Highest education level: Not on file  Occupational History   Not on file  Tobacco Use   Smoking status: Never   Smokeless tobacco: Never  Vaping Use   Vaping Use: Never used  Substance and Sexual Activity   Alcohol use: Not Currently   Drug use: Never   Sexual activity: Not on file  Other Topics Concern   Not on file  Social History Narrative   Not on file   Social Determinants of Health   Financial Resource Strain: Not on file  Food Insecurity: Not on file  Transportation Needs: Not on file  Physical Activity: Not on file  Stress: Not on file  Social Connections: Not on file     Family History: The patient's family history includes Healthy in her father and mother. ROS:   Please see the history of present illness.    All 14 point review of systems negative except as described per history of present illness  EKGs/Labs/Other Studies Reviewed:      Recent Labs: No results found for requested labs within last 8760 hours.  Recent Lipid Panel No results found for: CHOL, TRIG, HDL, CHOLHDL, VLDL, LDLCALC, LDLDIRECT  Physical Exam:    VS:  There were no vitals taken for this visit.    Wt Readings from Last 3 Encounters:  01/07/21 186 lb (84.4 kg)  07/14/20 179 lb (81.2 kg)  07/02/20 179 lb (81.2 kg)     GEN:  Well nourished, well developed in no acute distress HEENT: Normal NECK: No JVD; No carotid bruits LYMPHATICS: No lymphadenopathy CARDIAC: RRR, no murmurs, no rubs, no gallops RESPIRATORY:  Clear to auscultation without rales, wheezing or  rhonchi  ABDOMEN: Soft, non-tender, non-distended MUSCULOSKELETAL:  No edema; No deformity  SKIN: Warm and dry LOWER EXTREMITIES: no swelling NEUROLOGIC:  Alert and oriented x 3 PSYCHIATRIC:  Normal affect   ASSESSMENT:    1. Essential hypertension   2. Type 2 diabetes mellitus without complication, without long-term current use of insulin ([place])   3. Atypical chest pain    PLAN:    In order of problems listed above:  Essential hypertension blood pressure well controlled continue present management. Atypical chest pain denies having any. Diabetes mellitus controlled.  Doing well stable continue present management.   Medication Adjustments/Labs and Tests Ordered: Current medicines are reviewed at length with the patient today.  Concerns regarding medicines are outlined above.  No orders of the defined types were placed in this encounter.  Medication changes:  No orders of the defined types were placed in this encounter.   Signed, Park Liter, MD, [facility] 07/19/2021 1:55 PM    [place]

## 2021-07-19 NOTE — Patient Instructions (Signed)
Medication Instructions:  Your physician recommends that you continue on your current medications as directed. Please refer to the Current Medication list given to you today.  *If you need a refill on your cardiac medications before your next appointment, please call your pharmacy*   Lab Work: None If you have labs (blood work) drawn today and your tests are completely normal, you will receive your results only by: [place] (if you have MyChart) OR A paper copy in the mail If you have any lab test that is abnormal or we need to change your treatment, we will call you to review the results.   Testing/Procedures: Your physician has requested that you have an echocardiogram. Echocardiography is a painless test that uses sound waves to create images of your heart. It provides your doctor with information about the size and shape of your heart and how well your heart's chambers and valves are working. This procedure takes approximately one hour. There are no restrictions for this procedure.    Follow-Up: At [facility], you and your health needs are our priority.  As part of our continuing mission to provide you with exceptional heart care, we have created designated Provider Care Teams.  These Care Teams include your primary Cardiologist (physician) and Advanced Practice Providers (APPs -  Physician Assistants and Nurse Practitioners) who all work together to provide you with the care you need, when you need it.  We recommend signing up for the patient portal called "MyChart".  Sign up information is provided on this After Visit Summary.  MyChart is used to connect with patients for Virtual Visits (Telemedicine).  Patients are able to view lab/test results, encounter notes, upcoming appointments, etc.  Non-urgent messages can be sent to your provider as well.   To learn more about what you can do with MyChart, go to [email or web address].    Your next appointment:   6  month(s)  The format for your next appointment:   In Person  Provider:   Jenne Campus, MD     Other Instructions  Echocardiogram An echocardiogram is a test that uses sound waves (ultrasound) to produce images of the heart. Images from an echocardiogram can provide important information about: Heart size and shape. The size and thickness and movement of your heart's walls. Heart muscle function and strength. Heart valve function or if you have stenosis. Stenosis is when the heart valves are too narrow. If blood is flowing backward through the heart valves (regurgitation). A tumor or infectious growth around the heart valves. Areas of heart muscle that are not working well because of poor blood flow or injury from a heart attack. Aneurysm detection. An aneurysm is a weak or damaged part of an artery wall. The wall bulges out from the normal force of blood pumping through the body. Tell a health care provider about: Any allergies you have. All medicines you are taking, including vitamins, herbs, eye drops, creams, and over-the-counter medicines. Any blood disorders you have. Any surgeries you have had. Any medical conditions you have. Whether you are pregnant or may be pregnant. What are the risks? Generally, this is a safe test. However, problems may occur, including an allergic reaction to dye (contrast) that may be used during the test. What happens before the test? No specific preparation is needed. You may eat and drink normally. What happens during the test?  You will take off your clothes from the waist up and put on a hospital gown. Electrodes or electrocardiogram (ECG)patches may  be placed on your chest. The electrodes or patches are then connected to a device that monitors your heart rate and rhythm. You will lie down on a table for an ultrasound exam. A gel will be applied to your chest to help sound waves pass through your skin. A handheld device, called a  transducer, will be pressed against your chest and moved over your heart. The transducer produces sound waves that travel to your heart and bounce back (or "echo" back) to the transducer. These sound waves will be captured in real-time and changed into images of your heart that can be viewed on a video monitor. The images will be recorded on a computer and reviewed by your health care provider. You may be asked to change positions or hold your breath for a short time. This makes it easier to get different views or better views of your heart. In some cases, you may receive contrast through an IV in one of your veins. This can improve the quality of the pictures from your heart. The procedure may vary among health care providers and hospitals. What can I expect after the test? You may return to your normal, everyday life, including diet, activities, and medicines, unless your health care provider tells you not to do that. Follow these instructions at home: It is up to you to get the results of your test. Ask your health care provider, or the department that is doing the test, when your results will be ready. Keep all follow-up visits. This is important. Summary An echocardiogram is a test that uses sound waves (ultrasound) to produce images of the heart. Images from an echocardiogram can provide important information about the size and shape of your heart, heart muscle function, heart valve function, and other possible heart problems. You do not need to do anything to prepare before this test. You may eat and drink normally. After the echocardiogram is completed, you may return to your normal, everyday life, unless your health care provider tells you not to do that. This information is not intended to replace advice given to you by your health care provider. Make sure you discuss any questions you have with your health care provider. Document Revised: 05/11/2021 Document Reviewed: 04/20/2020 Elsevier  Patient Education  2022 [employer].

## 2021-07-28 ENCOUNTER — Other Ambulatory Visit: Payer: Self-pay

## 2021-07-28 ENCOUNTER — Ambulatory Visit (INDEPENDENT_AMBULATORY_CARE_PROVIDER_SITE_OTHER): Payer: Medicare Other

## 2021-07-28 DIAGNOSIS — E119 Type 2 diabetes mellitus without complications: Secondary | ICD-10-CM | POA: Diagnosis not present

## 2021-07-28 DIAGNOSIS — R0789 Other chest pain: Secondary | ICD-10-CM | POA: Diagnosis not present

## 2021-07-28 DIAGNOSIS — I10 Essential (primary) hypertension: Secondary | ICD-10-CM | POA: Diagnosis not present

## 2021-07-28 LAB — ECHOCARDIOGRAM COMPLETE
Area-P 1/2: 2.16 cm2
S' Lateral: 2.9 cm

## 2021-08-09 ENCOUNTER — Telehealth: Payer: Self-pay

## 2021-08-09 NOTE — Telephone Encounter (Signed)
Unable to reach the patient, Letter sent requesting a call back.

## 2021-08-09 NOTE — Telephone Encounter (Signed)
-----   Message from Park Liter, MD sent at 08/01/2021  9:51 AM EST ----- Echocardiogram showed preserved left ventricle ejection fraction, moderate left ventricle hypertrophy, overall looks good

## 2021-08-10 NOTE — Telephone Encounter (Signed)
Patient was returning call to discuss test results. Please call her cell phone [phone]

## 2021-08-10 NOTE — Telephone Encounter (Signed)
Patient son informed of results per dpr. He will let his mom know.

## 2021-09-03 ENCOUNTER — Other Ambulatory Visit: Payer: Self-pay | Admitting: Cardiology

## 2021-09-15 ENCOUNTER — Other Ambulatory Visit: Payer: Self-pay | Admitting: Cardiology

## 2021-10-20 DIAGNOSIS — Z6832 Body mass index (BMI) 32.0-32.9, adult: Secondary | ICD-10-CM | POA: Diagnosis not present

## 2021-10-20 DIAGNOSIS — I10 Essential (primary) hypertension: Secondary | ICD-10-CM | POA: Diagnosis not present

## 2021-10-20 DIAGNOSIS — F5104 Psychophysiologic insomnia: Secondary | ICD-10-CM | POA: Diagnosis not present

## 2021-10-20 DIAGNOSIS — E1165 Type 2 diabetes mellitus with hyperglycemia: Secondary | ICD-10-CM | POA: Diagnosis not present

## 2021-10-20 DIAGNOSIS — E119 Type 2 diabetes mellitus without complications: Secondary | ICD-10-CM | POA: Diagnosis not present

## 2021-10-20 DIAGNOSIS — E785 Hyperlipidemia, unspecified: Secondary | ICD-10-CM | POA: Diagnosis not present

## 2021-10-20 DIAGNOSIS — F320 Major depressive disorder, single episode, mild: Secondary | ICD-10-CM | POA: Diagnosis not present

## 2021-10-20 DIAGNOSIS — D487 Neoplasm of uncertain behavior of other specified sites: Secondary | ICD-10-CM | POA: Diagnosis not present

## 2021-10-20 DIAGNOSIS — D539 Nutritional anemia, unspecified: Secondary | ICD-10-CM | POA: Diagnosis not present

## 2021-10-20 DIAGNOSIS — B372 Candidiasis of skin and nail: Secondary | ICD-10-CM | POA: Diagnosis not present

## 2021-11-10 DIAGNOSIS — C44311 Basal cell carcinoma of skin of nose: Secondary | ICD-10-CM | POA: Diagnosis not present

## 2021-11-10 DIAGNOSIS — L300 Nummular dermatitis: Secondary | ICD-10-CM | POA: Diagnosis not present

## 2021-11-20 ENCOUNTER — Other Ambulatory Visit: Payer: Self-pay | Admitting: Cardiology

## 2021-12-09 DIAGNOSIS — C44311 Basal cell carcinoma of skin of nose: Secondary | ICD-10-CM | POA: Diagnosis not present

## 2022-01-01 ENCOUNTER — Other Ambulatory Visit: Payer: Self-pay | Admitting: Cardiology

## 2022-01-10 ENCOUNTER — Telehealth: Payer: Self-pay

## 2022-01-10 MED ORDER — HYDRALAZINE HCL 50 MG PO TABS: 75.0000 mg | ORAL_TABLET | Freq: Three times a day (TID) | ORAL | 0 refills | Status: DC

## 2022-01-10 NOTE — Telephone Encounter (Signed)
Received a call from the requesting a refill on Hydralazine.  ? ?She states this is her second time requesting the medication. I advised the patient that I would take care of the matter. She voiced understanding. ? ?Chart reviewed.  ? ?Rx(s) sent to pharmacy electronically. ? ? ?

## 2022-01-17 ENCOUNTER — Encounter: Payer: Self-pay | Admitting: Cardiology

## 2022-01-17 ENCOUNTER — Ambulatory Visit (INDEPENDENT_AMBULATORY_CARE_PROVIDER_SITE_OTHER): Payer: Medicare Other | Admitting: Cardiology

## 2022-01-17 VITALS — BP 138/76 | HR 65 | Ht 62.0 in | Wt 185.6 lb

## 2022-01-17 DIAGNOSIS — E119 Type 2 diabetes mellitus without complications: Secondary | ICD-10-CM

## 2022-01-17 DIAGNOSIS — I5181 Takotsubo syndrome: Secondary | ICD-10-CM

## 2022-01-17 DIAGNOSIS — I2510 Atherosclerotic heart disease of native coronary artery without angina pectoris: Secondary | ICD-10-CM

## 2022-01-17 DIAGNOSIS — E7800 Pure hypercholesterolemia, unspecified: Secondary | ICD-10-CM | POA: Diagnosis not present

## 2022-01-17 DIAGNOSIS — F320 Major depressive disorder, single episode, mild: Secondary | ICD-10-CM | POA: Diagnosis not present

## 2022-01-17 DIAGNOSIS — I10 Essential (primary) hypertension: Secondary | ICD-10-CM | POA: Diagnosis not present

## 2022-01-17 DIAGNOSIS — E1165 Type 2 diabetes mellitus with hyperglycemia: Secondary | ICD-10-CM | POA: Diagnosis not present

## 2022-01-17 DIAGNOSIS — E785 Hyperlipidemia, unspecified: Secondary | ICD-10-CM | POA: Diagnosis not present

## 2022-01-17 DIAGNOSIS — Z6833 Body mass index (BMI) 33.0-33.9, adult: Secondary | ICD-10-CM | POA: Diagnosis not present

## 2022-01-17 DIAGNOSIS — D539 Nutritional anemia, unspecified: Secondary | ICD-10-CM | POA: Diagnosis not present

## 2022-01-17 NOTE — Patient Instructions (Signed)

## 2022-01-17 NOTE — Progress Notes (Signed)
?Cardiology Office Note:   ? ?Date:  01/17/2022  ? ?ID:  Hayley Simon, DOB 09-14-1945, MRN [id number] ? ?PCP:  Lowella Dandy, NP  ?Cardiologist:  Jenne Campus, MD   ? ?Referring MD: Lowella Dandy, NP  ? ?Chief Complaint  ?Patient presents with  ? Follow-up  ? ? ?History of Present Illness:   ? ?Hayley Simon is a 76 y.o. female   with past medical history significant for coronary artery disease but only luminal disease based on cardiac catheterization from 2018.  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  She completely recovered latest echocardiogram showed preserved left ventricular ejection fraction.  She also blood pressure which is difficult to control, diabetes, dyslipidemia. ?She comes today 2 months for follow-up.  Overall she is doing very well.  She denies have any chest pain tightness squeezing pressure burning chest no palpitations dizziness swelling of lower extremities.  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 ? ?Past Medical History:  ?Diagnosis Date  ? Anxiety and depression   ? Atypical chest pain 07/02/2020  ? Coronary artery disease   ? Coronary artery disease involving native coronary artery of native heart without angina pectoris 03/19/2017  ? Cardiac catheterization - 02/2017 Diagnostic Procedure Summary 1. Mild non-obstructive CAD 2. Anterior/lateral/inferior wall hypokinesis, LVEF 35%. ? atypical Takotsubo cardiomyopathy  ? Diabetes mellitus without complication ([place])   ? Essential hypertension 03/24/2013  ? Hypertension   ? 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 Formatting of this note might be different from the original. - s/p curetting and bone grafting 04/17/13  ? Pure hypercholesterolemia 03/24/2013  ? PVD (peripheral vascular disease) ([place])   ? Stress-induced cardiomyopathy   ? 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 11/2017 - EF 50-55%   ? Type 2 diabetes mellitus without complication ([place])  2/75/1700  ? ? ?Past Surgical History:  ?Procedure Laterality Date  ? ABDOMINAL HYSTERECTOMY    ? BREAST BIOPSY    ? CARDIAC CATHETERIZATION    ? CESAREAN SECTION    ? TUMOR REMOVAL    ? ? ?Current Medications: ?Current Meds  ?Medication Sig  ? ALPRAZolam (XANAX) 0.5 MG tablet Take 0.5 mg by mouth as needed for anxiety or sleep.  ? aspirin 81 MG tablet Take 81 mg by mouth daily.   ? Calcium Carb-Ergocalciferol 250-125 MG-UNIT TABS Take 1 tablet by mouth 2 (two) times daily.  ? cloNIDine (CATAPRES) 0.1 MG tablet TAKE 1 TABLET (0.1 MG) BY MOUTH AS NEEDED (FOR SYSTOLIC BLOOD PRESSURE >174). (Patient taking differently: Take 0.1 mg by mouth as needed (see below). TAKE 1 TABLET (0.1 MG) BY MOUTH AS NEEDED (FOR SYSTOLIC BLOOD PRESSURE >944).)  ? clotrimazole-betamethasone (LOTRISONE) cream Apply 1 application topically daily.  ? FLUoxetine (PROZAC) 20 MG capsule Take 1 capsule by mouth daily.  ? furosemide (LASIX) 40 MG tablet TAKE 1 TABLET BY MOUTH EVERY DAY (Patient taking differently: Take 40 mg by mouth daily.)  ? glimepiride (AMARYL) 4 MG tablet Take 4 mg by mouth daily with breakfast. Take 1/2 tablet daily.  ? hydrALAZINE (APRESOLINE) 50 MG tablet Take 1.5 tablets (75 mg total) by mouth 3 (three) times daily.  ? ibuprofen (ADVIL) 800 MG tablet Take 800 mg by mouth 2 (two) times daily as needed for mild pain or moderate pain (Arthritis).  ? loratadine (CLARITIN) 10 MG tablet Take 1 tablet by mouth as needed for allergies.  ? losartan (COZAAR) 100 MG tablet  Take 1 tablet (100 mg total) by mouth daily.  ? metFORMIN (GLUCOPHAGE) 500 MG tablet Take 500 mg by mouth 2 (two) times daily.  ? metoprolol tartrate (LOPRESSOR) 50 MG tablet Take 1 tablet by mouth 2 (two) times daily.  ? minoxidil (LONITEN) 10 MG tablet TAKE 1 TABLET BY MOUTH EVERY DAY (Patient taking differently: Take 10 mg by mouth daily.)  ? Multiple Vitamin (MULTIVITAMIN) capsule Take 1 capsule by mouth daily. Unknown strength  ? simvastatin (ZOCOR) 40 MG  tablet Take 1 tablet (40 mg total) by mouth daily.  ? spironolactone (ALDACTONE) 25 MG tablet Take 0.5 tablets (12.5 mg total) by mouth daily.  ?  ? ?Allergies:   Penicillins  ? ?Social History  ? ?Socioeconomic History  ? Marital status: Widowed  ?  Spouse name: Not on file  ? Number of children: Not on file  ? Years of education: Not on file  ? Highest education level: Not on file  ?Occupational History  ? Not on file  ?Tobacco Use  ? Smoking status: Never  ? Smokeless tobacco: Never  ?Vaping Use  ? Vaping Use: Never used  ?Substance and Sexual Activity  ? Alcohol use: Not Currently  ? Drug use: Never  ? Sexual activity: Not on file  ?Other Topics Concern  ? Not on file  ?Social History Narrative  ? Not on file  ? ?Social Determinants of Health  ? ?Financial Resource Strain: Not on file  ?Food Insecurity: Not on file  ?Transportation Needs: Not on file  ?Physical Activity: Not on file  ?Stress: Not on file  ?Social Connections: Not on file  ?  ? ?Family History: ?The patient's family history includes Healthy in her father and mother. ?ROS:   ?Please see the history of present illness.    ?All 14 point review of systems negative except as described per history of present illness ? ?EKGs/Labs/Other Studies Reviewed:   ? ? ? ?Recent Labs: ?No results found for requested labs within last 8760 hours.  ?Recent Lipid Panel ?No results found for: CHOL, TRIG, HDL, CHOLHDL, VLDL, LDLCALC, LDLDIRECT ? ?Physical Exam:   ? ?VS:  BP 138/76 (BP Location: Left Arm, Patient Position: Sitting)   Pulse 65   Ht '5\' 2"'$  (1.575 m)   Wt 185 lb 9.6 oz (84.2 kg)   SpO2 97%   BMI 33.95 kg/m?    ? ?Wt Readings from Last 3 Encounters:  ?01/17/22 185 lb 9.6 oz (84.2 kg)  ?07/19/21 181 lb (82.1 kg)  ?01/07/21 186 lb (84.4 kg)  ?  ? ?GEN:  Well nourished, well developed in no acute distress ?HEENT: Normal ?NECK: No JVD; No carotid bruits ?LYMPHATICS: No lymphadenopathy ?CARDIAC: RRR, no murmurs, no rubs, no gallops ?RESPIRATORY:  Clear to  auscultation without rales, wheezing or rhonchi  ?ABDOMEN: Soft, non-tender, non-distended ?MUSCULOSKELETAL:  No edema; No deformity  ?SKIN: Warm and dry ?LOWER EXTREMITIES: no swelling ?NEUROLOGIC:  Alert and oriented x 3 ?PSYCHIATRIC:  Normal affect  ? ?ASSESSMENT:   ? ?1. Coronary artery disease involving native coronary artery of native heart without angina pectoris   ?2. Takotsubo syndrome   ?3. Essential hypertension   ?4. Type 2 diabetes mellitus without complication, without long-term current use of insulin ([place])   ?5. Pure hypercholesterolemia   ? ?PLAN:   ? ?In order of problems listed above: ? ?Coronary disease stable from that point review and appropriate medications I will continue ?History of Takotsubo I did review last echocardiogram from the end  of last year showing normal left ventricle ejection fraction ?Essential hypertension blood pressure well controlled she brought me her measurements from home it is well controlled continue present management ?Type 2 diabetes, hemoglobin A1c based on K PN is 5.7 this is from October 20, 2021 good control. ?Dyslipidemia I did review K PN which show me LDL of 65 HDL 47 this is from October 20, 2021 ? ? ?Medication Adjustments/Labs and Tests Ordered: ?Current medicines are reviewed at length with the patient today.  Concerns regarding medicines are outlined above.  ?No orders of the defined types were placed in this encounter. ? ?Medication changes: No orders of the defined types were placed in this encounter. ? ? ?Signed, ?Park Liter, MD, [facility] ?01/17/2022 1:41 PM    ?[place] ?

## 2022-02-18 ENCOUNTER — Other Ambulatory Visit: Payer: Self-pay | Admitting: Cardiology

## 2022-02-20 NOTE — Telephone Encounter (Signed)
Rx refill sent to pharmacy. 

## 2022-03-20 DIAGNOSIS — J069 Acute upper respiratory infection, unspecified: Secondary | ICD-10-CM | POA: Diagnosis not present

## 2022-03-23 DIAGNOSIS — J069 Acute upper respiratory infection, unspecified: Secondary | ICD-10-CM | POA: Diagnosis not present

## 2022-04-02 ENCOUNTER — Other Ambulatory Visit: Payer: Self-pay | Admitting: Cardiology

## 2022-04-06 ENCOUNTER — Other Ambulatory Visit: Payer: Self-pay

## 2022-04-06 MED ORDER — CLONIDINE HCL 0.1 MG PO TABS
0.1000 mg | ORAL_TABLET | ORAL | 2 refills | Status: DC | PRN
Start: 2022-04-06 — End: 2023-01-04

## 2022-04-07 ENCOUNTER — Other Ambulatory Visit: Payer: Self-pay | Admitting: Cardiology

## 2022-04-20 DIAGNOSIS — I10 Essential (primary) hypertension: Secondary | ICD-10-CM | POA: Diagnosis not present

## 2022-04-20 DIAGNOSIS — Z1231 Encounter for screening mammogram for malignant neoplasm of breast: Secondary | ICD-10-CM | POA: Diagnosis not present

## 2022-04-20 DIAGNOSIS — E785 Hyperlipidemia, unspecified: Secondary | ICD-10-CM | POA: Diagnosis not present

## 2022-04-20 DIAGNOSIS — M8589 Other specified disorders of bone density and structure, multiple sites: Secondary | ICD-10-CM | POA: Diagnosis not present

## 2022-04-20 DIAGNOSIS — D539 Nutritional anemia, unspecified: Secondary | ICD-10-CM | POA: Diagnosis not present

## 2022-04-20 DIAGNOSIS — E1165 Type 2 diabetes mellitus with hyperglycemia: Secondary | ICD-10-CM | POA: Diagnosis not present

## 2022-04-20 DIAGNOSIS — F5104 Psychophysiologic insomnia: Secondary | ICD-10-CM | POA: Diagnosis not present

## 2022-04-20 DIAGNOSIS — F320 Major depressive disorder, single episode, mild: Secondary | ICD-10-CM | POA: Diagnosis not present

## 2022-05-06 ENCOUNTER — Other Ambulatory Visit: Payer: Self-pay | Admitting: Cardiology

## 2022-06-19 DIAGNOSIS — J019 Acute sinusitis, unspecified: Secondary | ICD-10-CM | POA: Diagnosis not present

## 2022-06-19 DIAGNOSIS — I10 Essential (primary) hypertension: Secondary | ICD-10-CM | POA: Diagnosis not present

## 2022-06-23 DIAGNOSIS — Z1231 Encounter for screening mammogram for malignant neoplasm of breast: Secondary | ICD-10-CM | POA: Diagnosis not present

## 2022-07-02 ENCOUNTER — Other Ambulatory Visit: Payer: Self-pay | Admitting: Cardiology

## 2022-07-21 DIAGNOSIS — E785 Hyperlipidemia, unspecified: Secondary | ICD-10-CM | POA: Diagnosis not present

## 2022-07-21 DIAGNOSIS — I10 Essential (primary) hypertension: Secondary | ICD-10-CM | POA: Diagnosis not present

## 2022-07-21 DIAGNOSIS — F5104 Psychophysiologic insomnia: Secondary | ICD-10-CM | POA: Diagnosis not present

## 2022-07-21 DIAGNOSIS — Z139 Encounter for screening, unspecified: Secondary | ICD-10-CM | POA: Diagnosis not present

## 2022-07-21 DIAGNOSIS — Z9181 History of falling: Secondary | ICD-10-CM | POA: Diagnosis not present

## 2022-07-21 DIAGNOSIS — R059 Cough, unspecified: Secondary | ICD-10-CM | POA: Diagnosis not present

## 2022-07-21 DIAGNOSIS — D539 Nutritional anemia, unspecified: Secondary | ICD-10-CM | POA: Diagnosis not present

## 2022-07-21 DIAGNOSIS — E1165 Type 2 diabetes mellitus with hyperglycemia: Secondary | ICD-10-CM | POA: Diagnosis not present

## 2022-07-21 DIAGNOSIS — F320 Major depressive disorder, single episode, mild: Secondary | ICD-10-CM | POA: Diagnosis not present

## 2022-08-24 DIAGNOSIS — E1165 Type 2 diabetes mellitus with hyperglycemia: Secondary | ICD-10-CM | POA: Diagnosis not present

## 2022-08-24 DIAGNOSIS — I10 Essential (primary) hypertension: Secondary | ICD-10-CM | POA: Diagnosis not present

## 2022-09-30 ENCOUNTER — Other Ambulatory Visit: Payer: Self-pay | Admitting: Cardiology

## 2022-10-25 DIAGNOSIS — Z961 Presence of intraocular lens: Secondary | ICD-10-CM | POA: Diagnosis not present

## 2022-10-25 DIAGNOSIS — E119 Type 2 diabetes mellitus without complications: Secondary | ICD-10-CM | POA: Diagnosis not present

## 2022-10-26 DIAGNOSIS — E785 Hyperlipidemia, unspecified: Secondary | ICD-10-CM | POA: Diagnosis not present

## 2022-10-26 DIAGNOSIS — F320 Major depressive disorder, single episode, mild: Secondary | ICD-10-CM | POA: Diagnosis not present

## 2022-10-26 DIAGNOSIS — I10 Essential (primary) hypertension: Secondary | ICD-10-CM | POA: Diagnosis not present

## 2022-10-26 DIAGNOSIS — Z6833 Body mass index (BMI) 33.0-33.9, adult: Secondary | ICD-10-CM | POA: Diagnosis not present

## 2022-10-26 DIAGNOSIS — E1165 Type 2 diabetes mellitus with hyperglycemia: Secondary | ICD-10-CM | POA: Diagnosis not present

## 2022-10-26 LAB — HEMOGLOBIN A1C: A1c: 6

## 2022-10-27 LAB — COMPREHENSIVE METABOLIC PANEL: EGFR: 53

## 2022-10-30 DIAGNOSIS — L578 Other skin changes due to chronic exposure to nonionizing radiation: Secondary | ICD-10-CM | POA: Diagnosis not present

## 2022-10-30 DIAGNOSIS — L820 Inflamed seborrheic keratosis: Secondary | ICD-10-CM | POA: Diagnosis not present

## 2022-10-30 DIAGNOSIS — C44311 Basal cell carcinoma of skin of nose: Secondary | ICD-10-CM | POA: Diagnosis not present

## 2022-10-30 DIAGNOSIS — L814 Other melanin hyperpigmentation: Secondary | ICD-10-CM | POA: Diagnosis not present

## 2022-11-01 ENCOUNTER — Other Ambulatory Visit: Payer: Self-pay | Admitting: Cardiology

## 2022-11-08 ENCOUNTER — Other Ambulatory Visit: Payer: Self-pay | Admitting: Cardiology

## 2022-12-23 ENCOUNTER — Other Ambulatory Visit: Payer: Self-pay | Admitting: Cardiology

## 2022-12-27 ENCOUNTER — Other Ambulatory Visit: Payer: Self-pay | Admitting: Cardiology

## 2022-12-27 NOTE — Telephone Encounter (Signed)
Rx to pharmacy

## 2023-01-04 ENCOUNTER — Other Ambulatory Visit: Payer: Self-pay | Admitting: Cardiology

## 2023-01-28 NOTE — Progress Notes (Signed)
Cardiology Office Note:    Date:  01/29/2023   ID:  Hayley Simon, DOB 10-01-1945, MRN [id number]  PCP:  Hurshel Party, NP   [facility] Providers Cardiologist:  Gypsy Balsam, MD     Referring MD: Hurshel Party, NP   CC: follow up   History of Present Illness:    Hayley Simon is a 77 y.o. female with a hx of nonobstructive CAD non-STEMI 2019, DM 2, hypertension, anxiety, depression, PVD, stress-induced cardiomyopathy with most recent echo revealing recovered EF, hyperlipidemia.  She had a Lexiscan in November 2021, considered low risk study, small defect of mild severity present in the apical anterior location.  Most recent echocardiogram in November 2022 showed an EF of 60 to 65%, moderate concentric LVH, grade 1 DD, mild AR, mild dilatation of the ascending aorta measuring 37 mm.  Most recently she was evaluated by Dr. Bing Matter on 01/17/2022, at this time she was doing well from a cardiac perspective, she was advised to follow-up in 1 year.  She presents today for follow-up of her CAD and hypertension.  She presents a blood pressure log, which reveals her BP is well controlled. She continues to work 6 hours/day, mostly computer work. Her only complaint is swelling of her feet/ankles, and brief episodes of chest pain. Chest pain typically lasts < 30 seconds, occurs at rest, is not consistent with previous pain she experienced with prior MI. She denies chest pain, palpitations, dyspnea, pnd, orthopnea, n, v, dizziness, syncope, weight gain, or early satiety.    Past Medical History:  Diagnosis Date   Anxiety and depression    Atypical chest pain 07/02/2020   Coronary artery disease    Coronary artery disease involving native coronary artery of native heart without angina pectoris 03/19/2017   Cardiac catheterization - 02/2017 Diagnostic Procedure Summary 1. Mild non-obstructive CAD 2. Anterior/lateral/inferior wall hypokinesis, LVEF 35%. ? 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   ascorbic acid (VITAMIN C) 1000 MG tablet Take 1,000 mg by mouth daily.   aspirin 81 MG tablet Take 81 mg by mouth daily.    Calcium Carb-Ergocalciferol 250-125 MG-UNIT TABS Take 1 tablet by mouth 2 (two) times daily.   cloNIDine (CATAPRES) 0.1 MG tablet TAKE 1 TABLET BY MOUTH AS NEEDED FOR SYSTOLIC BLOOD PRESSURE OVER 180   clotrimazole-betamethasone (LOTRISONE) cream Apply 1 application topically daily.   FLUoxetine (PROZAC) 20 MG capsule Take 1 capsule by mouth daily.   furosemide (LASIX) 40 MG tablet TAKE 1 TABLET BY MOUTH EVERY DAY   glimepiride (AMARYL) 4 MG tablet Take 4 mg by mouth daily with breakfast. Take 1/2 tablet daily.  hydrALAZINE (APRESOLINE) 50 MG tablet TAKE 1.5 TABLETS (75 MG TOTAL) BY MOUTH 3 (THREE) TIMES DAILY.   ibuprofen (ADVIL) 800 MG tablet Take 800 mg by mouth 2 (two) times daily as needed for mild pain or moderate pain (Arthritis).   loratadine (CLARITIN) 10 MG tablet Take 1 tablet by mouth as needed for allergies.   losartan (COZAAR) 100 MG tablet TAKE 1 TABLET BY MOUTH EVERY DAY   metFORMIN (GLUCOPHAGE) 500 MG tablet Take 500 mg by mouth 2 (two) times daily.   metoprolol  tartrate (LOPRESSOR) 50 MG tablet Take 1 tablet by mouth 2 (two) times daily.   minoxidil (LONITEN) 10 MG tablet TAKE 1 TABLET BY MOUTH EVERY DAY   Multiple Vitamin (MULTIVITAMIN) capsule Take 1 capsule by mouth daily. Unknown strength   simvastatin (ZOCOR) 40 MG tablet TAKE 1 TABLET BY MOUTH EVERY DAY   spironolactone (ALDACTONE) 25 MG tablet TAKE 1/2 TABLET BY MOUTH EVERY DAY     Allergies:   Penicillins   Social History   Socioeconomic History   Marital status: Widowed    Spouse name: Not on file   Number of children: Not on file   Years of education: Not on file   Highest education level: Not on file  Occupational History   Not on file  Tobacco Use   Smoking status: Never   Smokeless tobacco: Never  Vaping Use   Vaping Use: Never used  Substance and Sexual Activity   Alcohol use: Not Currently   Drug use: Never   Sexual activity: Not on file  Other Topics Concern   Not on file  Social History Narrative   Not on file   Social Determinants of Health   Financial Resource Strain: Not on file  Food Insecurity: Not on file  Transportation Needs: Not on file  Physical Activity: Not on file  Stress: Not on file  Social Connections: Not on file     Family History: The patient's family history includes Healthy in her father and mother.  ROS:   Please see the history of present illness.     All other systems reviewed and are negative.  EKGs/Labs/Other Studies Reviewed:    The following studies were reviewed today: Cardiac Studies & Procedures     STRESS TESTS  MYOCARDIAL PERFUSION IMAGING 07/14/2020  Narrative  Nuclear stress EF: 79%.  The left ventricular ejection fraction is hyperdynamic (>65%).  There was no ST segment deviation noted during stress.  Defect 1: There is a small defect of mild severity present in the apical anterior location.  This is a low risk study.   ECHOCARDIOGRAM  ECHOCARDIOGRAM COMPLETE 07/28/2021  Narrative ECHOCARDIOGRAM  REPORT    Patient Name:   Hayley Simon Date of Exam: 07/28/2021 Medical Rec #:  [id number]       Height:       62.0 in Accession #:    [id number]      Weight:       181.0 lb Date of Birth:  20-Mar-1946       BSA:          1.832 m Patient Age:    75 years        BP:           126/74 mmHg Patient Gender: F               HR:           58 bpm. Exam Location:  [place]  Procedure: 2D Echo, Cardiac  Doppler, Color Doppler and Strain Analysis  Indications:    Chest Pain R07.9  History:        Patient has prior history of Echocardiogram examinations, most recent 07/28/2019. CAD, PAD; Risk Factors:Hypertension, Diabetes and Dyslipidemia.  Sonographer:    Louie Boston RDCS Referring Phys: [phone] Marveen Reeks KRASOWSKI  IMPRESSIONS   1. Left ventricular ejection fraction, by estimation, is 60 to 65%. The left ventricle has normal function. The left ventricle has no regional wall motion abnormalities. There is moderate concentric left ventricular hypertrophy. Left ventricular diastolic parameters are consistent with Grade I diastolic dysfunction (impaired relaxation). 2. Right ventricular systolic function is normal. The right ventricular size is normal. There is normal pulmonary artery systolic pressure. 3. The mitral valve is normal in structure. No evidence of mitral valve regurgitation. No evidence of mitral stenosis. 4. The aortic valve is tricuspid. Aortic valve regurgitation is mild. Aortic valve sclerosis is present, with no evidence of aortic valve stenosis. 5. Aortic normal DTA. There is mild dilatation of the ascending aorta, measuring 37 mm. 6. The inferior vena cava is normal in size with greater than 50% respiratory variability, suggesting right atrial pressure of 3 mmHg.  FINDINGS Left Ventricle: Left ventricular ejection fraction, by estimation, is 60 to 65%. The left ventricle has normal function. The left ventricle has no regional wall motion abnormalities. Global longitudinal  strain performed but not reported based on interpreter judgement due to suboptimal tracking. The left ventricular internal cavity size was normal in size. There is moderate concentric left ventricular hypertrophy. Left ventricular diastolic parameters are consistent with Grade I diastolic dysfunction (impaired relaxation). Normal left ventricular filling pressure.  Right Ventricle: The right ventricular size is normal. No increase in right ventricular wall thickness. Right ventricular systolic function is normal. There is normal pulmonary artery systolic pressure. The tricuspid regurgitant velocity is 1.52 m/s, and with an assumed right atrial pressure of 3 mmHg, the estimated right ventricular systolic pressure is 12.2 mmHg.  Left Atrium: Left atrial size was normal in size.  Right Atrium: Right atrial size was normal in size.  Pericardium: There is no evidence of pericardial effusion.  Mitral Valve: The mitral valve is normal in structure. No evidence of mitral valve regurgitation. No evidence of mitral valve stenosis.  Tricuspid Valve: The tricuspid valve is normal in structure. Tricuspid valve regurgitation is trivial. No evidence of tricuspid stenosis.  Aortic Valve: The aortic valve is tricuspid. Aortic valve regurgitation is mild. Aortic valve sclerosis is present, with no evidence of aortic valve stenosis.  Pulmonic Valve: The pulmonic valve was normal in structure. Pulmonic valve regurgitation is not visualized. No evidence of pulmonic stenosis.  Aorta: The aortic root is normal in size and structure, the aortic arch was not well visualized and normal DTA. There is mild dilatation of the ascending aorta, measuring 37 mm.  Venous: The inferior vena cava is normal in size with greater than 50% respiratory variability, suggesting right atrial pressure of 3 mmHg.  IAS/Shunts: No atrial level shunt detected by color flow Doppler.   LEFT VENTRICLE PLAX 2D LVIDd:         4.40 cm    Diastology LVIDs:         2.90 cm   LV e' medial:    4.79 cm/s LV PW:         1.30 cm   LV E/e' medial:  14.7 LV IVS:        1.30 cm   LV e' lateral:  4.68 cm/s LVOT diam:     2.10 cm   LV E/e' lateral: 15.0 LV SV:         97 LV SV Index:   53 LVOT Area:     3.46 cm   RIGHT VENTRICLE             IVC RV S prime:     19.00 cm/s  IVC diam: 1.50 cm TAPSE (M-mode): 3.0 cm  LEFT ATRIUM             Index        RIGHT ATRIUM           Index LA diam:        4.10 cm 2.24 cm/m   RA Area:     12.30 cm LA Vol (A2C):   66.1 ml 36.08 ml/m  RA Volume:   26.60 ml  14.52 ml/m LA Vol (A4C):   55.2 ml 30.13 ml/m LA Biplane Vol: 61.4 ml 33.51 ml/m AORTIC VALVE LVOT Vmax:   108.00 cm/s LVOT Vmean:  80.600 cm/s LVOT VTI:    0.280 m  AORTA Ao Root diam: 3.10 cm Ao Asc diam:  3.65 cm Ao Desc diam: 2.00 cm  MITRAL VALVE               TRICUSPID VALVE MV Area (PHT): 2.16 cm    TR Peak grad:   9.2 mmHg MV Decel Time: 352 msec    TR Vmax:        152.00 cm/s MV E velocity: 70.30 cm/s MV A velocity: 79.70 cm/s  SHUNTS MV E/A ratio:  0.88        Systemic VTI:  0.28 m Systemic Diam: 2.10 cm  Norman Herrlich MD Electronically signed by Norman Herrlich MD Signature Date/Time: 07/28/2021/5:41:35 PM    Final              EKG:  EKG is  ordered today.  The ekg ordered today demonstrates normal sinus rhythm, with left axis deviation, heart rate 65 bpm, consistent with prior EKG tracings.  Recent Labs: No results found for requested labs within last 365 days.  Recent Lipid Panel No results found for: "CHOL", "TRIG", "HDL", "CHOLHDL", "VLDL", "LDLCALC", "LDLDIRECT"   Risk Assessment/Calculations:                Physical Exam:    VS:  BP 120/80 (BP Location: Right Arm, Patient Position: Sitting, Cuff Size: Normal)   Pulse 65   Ht 5\' 2"  (1.575 m)   Wt 194 lb (88 kg)   SpO2 95%   BMI 35.48 kg/m     Wt Readings from Last 3 Encounters:  01/29/23 194 lb (88 kg)  01/17/22 185 lb 9.6 oz  (84.2 kg)  07/19/21 181 lb (82.1 kg)     GEN:  Well nourished, well developed in no acute distress HEENT: Normal NECK: No JVD; No carotid bruits LYMPHATICS: No lymphadenopathy CARDIAC: RRR, no murmurs, rubs, gallops RESPIRATORY:  Clear to auscultation without rales, wheezing or rhonchi  ABDOMEN: Soft, non-tender, non-distended MUSCULOSKELETAL: Trace pedal edema, no deformity  SKIN: Warm and dry NEUROLOGIC:  Alert and oriented x 3 PSYCHIATRIC:  Normal affect   ASSESSMENT:    1. Coronary artery disease involving native coronary artery of native heart without angina pectoris   2. Heart failure with improved ejection fraction (HFimpEF) (HCC)   3. Essential hypertension   4. Hyperlipidemia LDL goal <70   5. Aortic dilatation (HCC)    PLAN:  In order of problems listed above:  CAD-s/p non-STEMI in 2019, Stable with no anginal symptoms. No indication for ischemic evaluation.  Continue aspirin 81 mg daily, continue Lopressor 50 mg twice daily, continue Zocor 40 mg daily. Heart failure with improved ejection fraction-most recent echocardiogram in 2022 revealed an EF 60 to 65%, moderate concentric LVH, grade 1 DD, mild dilatation of the ascending order 37 mm.  NYHA class I, euvolemic.  Continue losartan 100 mg daily, continue Lopressor 50 mg twice daily, continue Lasix 40 mg every day, continue spironolactone 25 mg every day. Pedal edema-she feels like this is relatively new for her in spite of taking her Lasix and spironolactone.  Will repeat echocardiogram. Ascending aortic dilatation-mildly dilated at 37 mm in echo in 2022, will repeat echocardiogram for surveillance. Hypertension-blood pressure is well-controlled at 120/80, she keeps excellent records of her blood pressure readings at home, they are all well-controlled.  Continue clonidine 0.1 mg, she states she takes this every evening, Apresoline 75 mg 3 times daily, losartan 100 mg daily, metoprolol 50 mg twice  daily. Hyperlipidemia-most recent LDL was well-controlled at 57, this is primarily managed by her PCP, continue Zocor 40 mg daily.  Disposition-repeat echocardiogram for aortic dilatation surveillance and pedal edema.  Return in 6 months.           Medication Adjustments/Labs and Tests Ordered: Current medicines are reviewed at length with the patient today.  Concerns regarding medicines are outlined above.  Orders Placed This Encounter  Procedures   EKG 12-Lead   ECHOCARDIOGRAM COMPLETE   No orders of the defined types were placed in this encounter.   Patient Instructions  Medication Instructions:  Your physician recommends that you continue on your current medications as directed. Please refer to the Current Medication list given to you today.  *If you need a refill on your cardiac medications before your next appointment, please call your pharmacy*   Lab Work: NONE If you have labs (blood work) drawn today and your tests are completely normal, you will receive your results only by: MyChart Message (if you have MyChart) OR A paper copy in the mail If you have any lab test that is abnormal or we need to change your treatment, we will call you to review the results.   Testing/Procedures:Your physician has requested that you have an echocardiogram. Echocardiography is a painless test that uses sound waves to create images of your heart. It provides your doctor with information about the size and shape of your heart and how well your heart's chambers and valves are working. This procedure takes approximately one hour. There are no restrictions for this procedure. Please do NOT wear cologne, perfume, aftershave, or lotions (deodorant is allowed). Please arrive 15 minutes prior to your appointment time.    Follow-Up: At [facility], you and your health needs are our priority.  As part of our continuing mission to provide you with exceptional heart care, we have created  designated Provider Care Teams.  These Care Teams include your primary Cardiologist (physician) and Advanced Practice Providers (APPs -  Physician Assistants and Nurse Practitioners) who all work together to provide you with the care you need, when you need it.  We recommend signing up for the patient portal called "MyChart".  Sign up information is provided on this After Visit Summary.  MyChart is used to connect with patients for Virtual Visits (Telemedicine).  Patients are able to view lab/test results, encounter notes, upcoming appointments, etc.  Non-urgent messages can  be sent to your provider as well.   To learn more about what you can do with MyChart, go to [email or web address].    Your next appointment:   6 month(s)  Provider:   Gypsy Balsam, MD    Other Instructions     Signed, Flossie Dibble, NP  01/29/2023 4:33 PM    [facility]

## 2023-01-29 ENCOUNTER — Ambulatory Visit: Payer: Medicare Other | Attending: Cardiology | Admitting: Cardiology

## 2023-01-29 ENCOUNTER — Encounter: Payer: Self-pay | Admitting: Cardiology

## 2023-01-29 VITALS — BP 120/80 | HR 65 | Ht 62.0 in | Wt 194.0 lb

## 2023-01-29 DIAGNOSIS — I5032 Chronic diastolic (congestive) heart failure: Secondary | ICD-10-CM | POA: Diagnosis present

## 2023-01-29 DIAGNOSIS — I5020 Unspecified systolic (congestive) heart failure: Secondary | ICD-10-CM

## 2023-01-29 DIAGNOSIS — I2510 Atherosclerotic heart disease of native coronary artery without angina pectoris: Secondary | ICD-10-CM

## 2023-01-29 DIAGNOSIS — E785 Hyperlipidemia, unspecified: Secondary | ICD-10-CM | POA: Diagnosis present

## 2023-01-29 DIAGNOSIS — I77819 Aortic ectasia, unspecified site: Secondary | ICD-10-CM | POA: Diagnosis present

## 2023-01-29 DIAGNOSIS — I10 Essential (primary) hypertension: Secondary | ICD-10-CM

## 2023-01-29 DIAGNOSIS — R600 Localized edema: Secondary | ICD-10-CM

## 2023-01-29 NOTE — Patient Instructions (Signed)
Medication Instructions:  Your physician recommends that you continue on your current medications as directed. Please refer to the Current Medication list given to you today.  *If you need a refill on your cardiac medications before your next appointment, please call your pharmacy*   Lab Work: NONE If you have labs (blood work) drawn today and your tests are completely normal, you will receive your results only by: MyChart Message (if you have MyChart) OR A paper copy in the mail If you have any lab test that is abnormal or we need to change your treatment, we will call you to review the results.   Testing/Procedures: Your physician has requested that you have an echocardiogram. Echocardiography is a painless test that uses sound waves to create images of your heart. It provides your doctor with information about the size and shape of your heart and how well your heart's chambers and valves are working. This procedure takes approximately one hour. There are no restrictions for this procedure. Please do NOT wear cologne, perfume, aftershave, or lotions (deodorant is allowed). Please arrive 15 minutes prior to your appointment time.    Follow-Up: At [facility], you and your health needs are our priority.  As part of our continuing mission to provide you with exceptional heart care, we have created designated Provider Care Teams.  These Care Teams include your primary Cardiologist (physician) and Advanced Practice Providers (APPs -  Physician Assistants and Nurse Practitioners) who all work together to provide you with the care you need, when you need it.  We recommend signing up for the patient portal called "MyChart".  Sign up information is provided on this After Visit Summary.  MyChart is used to connect with patients for Virtual Visits (Telemedicine).  Patients are able to view lab/test results, encounter notes, upcoming appointments, etc.  Non-urgent messages can be sent to your  provider as well.   To learn more about what you can do with MyChart, go to [email or web address].    Your next appointment:   6 month(s)  Provider:   Robert Krasowski, MD    Other Instructions   

## 2023-01-30 ENCOUNTER — Other Ambulatory Visit: Payer: Self-pay | Admitting: Cardiology

## 2023-01-30 DIAGNOSIS — I10 Essential (primary) hypertension: Secondary | ICD-10-CM | POA: Diagnosis not present

## 2023-01-30 DIAGNOSIS — Z6832 Body mass index (BMI) 32.0-32.9, adult: Secondary | ICD-10-CM | POA: Diagnosis not present

## 2023-01-30 DIAGNOSIS — M1990 Unspecified osteoarthritis, unspecified site: Secondary | ICD-10-CM | POA: Diagnosis not present

## 2023-01-30 DIAGNOSIS — F320 Major depressive disorder, single episode, mild: Secondary | ICD-10-CM | POA: Diagnosis not present

## 2023-01-30 DIAGNOSIS — M79641 Pain in right hand: Secondary | ICD-10-CM | POA: Diagnosis not present

## 2023-01-30 DIAGNOSIS — E1165 Type 2 diabetes mellitus with hyperglycemia: Secondary | ICD-10-CM | POA: Diagnosis not present

## 2023-01-30 DIAGNOSIS — E785 Hyperlipidemia, unspecified: Secondary | ICD-10-CM | POA: Diagnosis not present

## 2023-01-31 ENCOUNTER — Other Ambulatory Visit: Payer: Self-pay | Admitting: *Deleted

## 2023-01-31 MED ORDER — HYDRALAZINE HCL 50 MG PO TABS: 75.0000 mg | ORAL_TABLET | Freq: Three times a day (TID) | ORAL | 6 refills | Status: DC

## 2023-02-15 DIAGNOSIS — M19041 Primary osteoarthritis, right hand: Secondary | ICD-10-CM | POA: Diagnosis not present

## 2023-02-15 DIAGNOSIS — M79641 Pain in right hand: Secondary | ICD-10-CM | POA: Diagnosis not present

## 2023-02-20 ENCOUNTER — Other Ambulatory Visit: Payer: Self-pay | Admitting: Cardiology

## 2023-02-20 NOTE — Telephone Encounter (Signed)
Rx sent to pharmacy   

## 2023-02-23 ENCOUNTER — Ambulatory Visit: Payer: Medicare Other | Attending: Cardiology

## 2023-02-23 DIAGNOSIS — I5032 Chronic diastolic (congestive) heart failure: Secondary | ICD-10-CM | POA: Diagnosis present

## 2023-02-23 DIAGNOSIS — I10 Essential (primary) hypertension: Secondary | ICD-10-CM | POA: Diagnosis present

## 2023-02-23 DIAGNOSIS — E785 Hyperlipidemia, unspecified: Secondary | ICD-10-CM | POA: Insufficient documentation

## 2023-02-23 DIAGNOSIS — I77819 Aortic ectasia, unspecified site: Secondary | ICD-10-CM | POA: Diagnosis present

## 2023-02-23 DIAGNOSIS — I2510 Atherosclerotic heart disease of native coronary artery without angina pectoris: Secondary | ICD-10-CM | POA: Diagnosis present

## 2023-02-23 LAB — ECHOCARDIOGRAM COMPLETE
Area-P 1/2: 2.95 cm2
S' Lateral: 3 cm

## 2023-03-01 ENCOUNTER — Telehealth: Payer: Self-pay

## 2023-03-01 ENCOUNTER — Other Ambulatory Visit: Payer: Self-pay

## 2023-03-01 DIAGNOSIS — I5032 Chronic diastolic (congestive) heart failure: Secondary | ICD-10-CM

## 2023-03-01 MED ORDER — TORSEMIDE 20 MG PO TABS: 20.0000 mg | ORAL_TABLET | Freq: Every day | ORAL | 3 refills | Status: DC

## 2023-03-01 NOTE — Telephone Encounter (Signed)
-----   Message from Flossie Dibble, NP sent at 02/23/2023  4:26 PM EDT ----- Ms. Romans, Your echo showed that your heart is squeezing well and a little stiff when it relaxes, which is normal with age. Mild thickening of your left ventricle which is usually from high blood pressure. Valves look good. Unchanged from last echo. Lets stop your furosemide and start you on torsemide. It sometimes has better results with swelling if the furosemide is not working as well.  Take torsemide 20 mg daily. Return in 2 weeks to recheck your kidney function.  Best, Victorino Dike

## 2023-03-06 DIAGNOSIS — R2231 Localized swelling, mass and lump, right upper limb: Secondary | ICD-10-CM | POA: Diagnosis not present

## 2023-03-07 ENCOUNTER — Other Ambulatory Visit: Payer: Self-pay | Admitting: Cardiology

## 2023-03-19 DIAGNOSIS — I5032 Chronic diastolic (congestive) heart failure: Secondary | ICD-10-CM | POA: Diagnosis not present

## 2023-03-20 ENCOUNTER — Telehealth: Payer: Self-pay | Admitting: *Deleted

## 2023-03-20 LAB — BASIC METABOLIC PANEL WITH GFR
BUN/Creatinine Ratio: 13 (ref 12–28)
BUN: 16 mg/dL (ref 8–27)
CO2: 24 mmol/L (ref 20–29)
Calcium: 9.5 mg/dL (ref 8.7–10.3)
Chloride: 101 mmol/L (ref 96–106)
Creatinine, Ser: 1.2 mg/dL — ABNORMAL HIGH (ref 0.57–1.00)
Glucose: 188 mg/dL — ABNORMAL HIGH (ref 70–99)
Potassium: 4.1 mmol/L (ref 3.5–5.2)
Sodium: 138 mmol/L (ref 134–144)
eGFR: 47 mL/min/1.73 — ABNORMAL LOW

## 2023-03-20 NOTE — Telephone Encounter (Signed)
Pt states she does not go to the bathroom that much, less so since changing from Furosemide to Torsemide.

## 2023-03-20 NOTE — Telephone Encounter (Signed)
Left message for pt to call back about edema.

## 2023-03-20 NOTE — Telephone Encounter (Signed)
-----   Message from Flossie Dibble, NP sent at 03/20/2023  7:54 AM EDT ----- Slight kidney dysfunction, otherwise stable results.

## 2023-03-20 NOTE — Telephone Encounter (Signed)
Let pt know she can take an extra Torsemide if her feet are still swelling. She said that she would do that and thanked me for calling.

## 2023-03-21 DIAGNOSIS — R2231 Localized swelling, mass and lump, right upper limb: Secondary | ICD-10-CM

## 2023-03-21 HISTORY — DX: Localized swelling, mass and lump, right upper limb: R22.31

## 2023-03-26 ENCOUNTER — Other Ambulatory Visit: Payer: Self-pay | Admitting: Cardiology

## 2023-04-12 DIAGNOSIS — D480 Neoplasm of uncertain behavior of bone and articular cartilage: Secondary | ICD-10-CM | POA: Diagnosis not present

## 2023-04-12 DIAGNOSIS — Z880 Allergy status to penicillin: Secondary | ICD-10-CM | POA: Diagnosis not present

## 2023-04-12 DIAGNOSIS — I129 Hypertensive chronic kidney disease with stage 1 through stage 4 chronic kidney disease, or unspecified chronic kidney disease: Secondary | ICD-10-CM | POA: Diagnosis not present

## 2023-04-12 DIAGNOSIS — E1122 Type 2 diabetes mellitus with diabetic chronic kidney disease: Secondary | ICD-10-CM | POA: Diagnosis not present

## 2023-04-12 DIAGNOSIS — I252 Old myocardial infarction: Secondary | ICD-10-CM | POA: Diagnosis not present

## 2023-04-12 DIAGNOSIS — I2510 Atherosclerotic heart disease of native coronary artery without angina pectoris: Secondary | ICD-10-CM | POA: Diagnosis not present

## 2023-04-12 DIAGNOSIS — M85541 Aneurysmal bone cyst, right hand: Secondary | ICD-10-CM | POA: Diagnosis not present

## 2023-04-12 DIAGNOSIS — Z79899 Other long term (current) drug therapy: Secondary | ICD-10-CM | POA: Diagnosis not present

## 2023-04-12 DIAGNOSIS — Z7982 Long term (current) use of aspirin: Secondary | ICD-10-CM | POA: Diagnosis not present

## 2023-04-12 DIAGNOSIS — R2231 Localized swelling, mass and lump, right upper limb: Secondary | ICD-10-CM | POA: Diagnosis not present

## 2023-04-12 DIAGNOSIS — Z7984 Long term (current) use of oral hypoglycemic drugs: Secondary | ICD-10-CM | POA: Diagnosis not present

## 2023-04-12 DIAGNOSIS — N189 Chronic kidney disease, unspecified: Secondary | ICD-10-CM | POA: Diagnosis not present

## 2023-04-12 DIAGNOSIS — E785 Hyperlipidemia, unspecified: Secondary | ICD-10-CM | POA: Diagnosis not present

## 2023-04-12 DIAGNOSIS — F419 Anxiety disorder, unspecified: Secondary | ICD-10-CM | POA: Diagnosis not present

## 2023-05-03 DIAGNOSIS — E6609 Other obesity due to excess calories: Secondary | ICD-10-CM | POA: Diagnosis not present

## 2023-05-03 DIAGNOSIS — F320 Major depressive disorder, single episode, mild: Secondary | ICD-10-CM | POA: Diagnosis not present

## 2023-05-03 DIAGNOSIS — I10 Essential (primary) hypertension: Secondary | ICD-10-CM | POA: Diagnosis not present

## 2023-05-03 DIAGNOSIS — F5104 Psychophysiologic insomnia: Secondary | ICD-10-CM | POA: Diagnosis not present

## 2023-05-03 DIAGNOSIS — Z6832 Body mass index (BMI) 32.0-32.9, adult: Secondary | ICD-10-CM | POA: Diagnosis not present

## 2023-05-03 DIAGNOSIS — E785 Hyperlipidemia, unspecified: Secondary | ICD-10-CM | POA: Diagnosis not present

## 2023-05-03 DIAGNOSIS — M79641 Pain in right hand: Secondary | ICD-10-CM | POA: Diagnosis not present

## 2023-05-03 DIAGNOSIS — E1165 Type 2 diabetes mellitus with hyperglycemia: Secondary | ICD-10-CM | POA: Diagnosis not present

## 2023-05-28 DIAGNOSIS — Z4789 Encounter for other orthopedic aftercare: Secondary | ICD-10-CM | POA: Diagnosis not present

## 2023-05-28 DIAGNOSIS — R2231 Localized swelling, mass and lump, right upper limb: Secondary | ICD-10-CM | POA: Diagnosis not present

## 2023-05-28 DIAGNOSIS — M8550 Aneurysmal bone cyst, unspecified site: Secondary | ICD-10-CM | POA: Diagnosis not present

## 2023-06-06 ENCOUNTER — Other Ambulatory Visit: Payer: Self-pay | Admitting: Cardiology

## 2023-06-07 ENCOUNTER — Other Ambulatory Visit: Payer: Self-pay

## 2023-06-07 MED ORDER — HYDRALAZINE HCL 50 MG PO TABS: 75.0000 mg | ORAL_TABLET | Freq: Three times a day (TID) | ORAL | 1 refills | Status: DC

## 2023-06-23 ENCOUNTER — Other Ambulatory Visit: Payer: Self-pay | Admitting: Cardiology

## 2023-06-25 ENCOUNTER — Other Ambulatory Visit: Payer: Self-pay

## 2023-06-25 MED ORDER — MINOXIDIL 10 MG PO TABS: 10.0000 mg | ORAL_TABLET | Freq: Every day | ORAL | 3 refills | Status: DC

## 2023-07-03 DIAGNOSIS — Z1231 Encounter for screening mammogram for malignant neoplasm of breast: Secondary | ICD-10-CM | POA: Diagnosis not present

## 2023-07-29 NOTE — Progress Notes (Unsigned)
Cardiology Office Note:    Date:  07/29/2023   ID:  Hayley Simon, DOB 04-06-1946, MRN [id number]  PCP:  Hurshel Party, NP   [facility] Providers Cardiologist:  Gypsy Balsam, MD     Referring MD: Hurshel Party, NP   CC: follow up   History of Present Illness:    Hayley Simon is a 77 y.o. female with a hx of nonobstructive CAD non-STEMI 2019, DM 2, hypertension, anxiety, depression, PVD, stress-induced cardiomyopathy with most recent echo revealing recovered EF, hyperlipidemia.  02/23/2023 echo EF 60-65%, mild LVH, grade I DD 07/28/2021 echo 60-65%, moderate concentric LVH, grade I DD, mild AR, mild dilatation of ascending aorta 37 mm 07/2020 lexiscan low risk, normal study  Most recently she was evaluated by Dr. Bing Matter on 01/17/2022, at this time she was doing well from a cardiac perspective, she was advised to follow-up in 1 year.  She presents today for follow-up of her CAD and hypertension.  She presents a blood pressure log, which reveals her BP is well controlled. She continues to work 6 hours/day, mostly computer work. Her only complaint is swelling of her feet/ankles, and brief episodes of chest pain. Chest pain typically lasts < 30 seconds, occurs at rest, is not consistent with previous pain she experienced with prior MI. She denies chest pain, palpitations, dyspnea, pnd, orthopnea, n, v, dizziness, syncope, weight gain, or early satiety.    Past Medical History:  Diagnosis Date   Anxiety and depression    Atypical chest pain 07/02/2020   Coronary artery disease    Coronary artery disease involving native coronary artery of native heart without angina pectoris 03/19/2017   Cardiac catheterization - 02/2017 Diagnostic Procedure Summary 1. Mild non-obstructive CAD 2. Anterior/lateral/inferior wall hypokinesis, LVEF 35%. ? 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     Allergies:   Penicillins   Social History   Socioeconomic History   Marital status: Widowed    Spouse name: Not on file   Number of children: Not on file   Years of education: Not on file   Highest education level: Not on file  Occupational History   Not on file  Tobacco Use   Smoking status: Never   Smokeless tobacco: Never  Vaping Use   Vaping status: Never Used  Substance and Sexual Activity   Alcohol use: Not Currently   Drug use: Never   Sexual activity: Not on file  Other Topics Concern   Not on file  Social History Narrative   Not on file   Social Determinants of Health   Financial Resource Strain: Not on file  Food Insecurity: Not on file  Transportation Needs: Not on file  Physical Activity: Not on file  Stress: Not on file  Social Connections: Not on file     Family History: The patient's family history includes Healthy in her father and mother.  ROS:   Please see the history of present illness.     All other systems reviewed and are negative.  EKGs/Labs/Other Studies Reviewed:    The following studies were reviewed today: Cardiac Studies & Procedures     STRESS TESTS  MYOCARDIAL PERFUSION IMAGING 07/14/2020  Narrative  Nuclear stress EF: 79%.  The left  ventricular ejection fraction is hyperdynamic (>65%).  There was no ST segment deviation noted during stress.  Defect 1: There is a small defect of mild severity present in the apical anterior location.  This is a low risk study.   ECHOCARDIOGRAM  ECHOCARDIOGRAM COMPLETE 02/23/2023  Narrative ECHOCARDIOGRAM REPORT    Patient Name:   Hayley Simon Date of Exam: 02/23/2023 Medical Rec #:  [id number]       Height:       62.0 in Accession #:    [id number]      Weight:       194.0 lb Date of Birth:  04-Feb-1946       BSA:          1.887 m Patient Age:    76 years        BP:           120/80 mmHg Patient Gender: F               HR:           64 bpm. Exam Location:  [place]  Procedure: 2D Echo, Cardiac Doppler, Color Doppler and Strain Analysis  Indications:    Coronary artery disease involving native coronary artery of native heart without angina pectoris [I25.10 (ICD-10-CM)]; Heart failure with improved ejection fraction (HFimpEF) (HCC) [I50.32 (ICD-10-CM)]; Essential hypertension [I10 (ICD-10-CM)]; Hyperlipidemia LDL goal <70 [E78.5 (ICD-10-CM)]; Aortic dilatation (HCC) [I77.819 (ICD-10-CM)]  History:        Patient has prior history of Echocardiogram examinations, most recent 07/28/2021. CHF, CAD; Risk Factors:Hypertension and Dyslipidemia.  Sonographer:    Margreta Journey RDCS Referring Phys: [phone] Harjot Dibello C Raymar Joiner  IMPRESSIONS   1. Left ventricular ejection fraction, by estimation, is 60 to 65%. The left ventricle has normal function. The left ventricle has no regional wall motion abnormalities. There is mild left ventricular hypertrophy. Left ventricular diastolic parameters are consistent with Grade I diastolic dysfunction (impaired relaxation). GLS-18.6% 2. Right ventricular systolic function is normal. The right ventricular size is normal. 3. Left atrial size was moderately dilated. 4. The mitral valve is normal in structure. No evidence of mitral valve  regurgitation. No evidence of mitral stenosis. 5. The aortic valve is normal in structure. Aortic valve regurgitation is not visualized. Aortic valve sclerosis is present, with no evidence of aortic valve stenosis. 6. The inferior vena cava is normal in size with greater than 50% respiratory variability, suggesting right atrial pressure of 3 mmHg.  FINDINGS Left Ventricle: Left ventricular ejection fraction, by estimation, is 60 to 65%. The left ventricle has normal function. The left ventricle has no regional wall motion abnormalities. The left ventricular internal cavity size was normal in size. There is mild left ventricular hypertrophy. Left ventricular diastolic parameters are consistent with Grade I diastolic dysfunction (impaired relaxation).  Right Ventricle: The right ventricular size is normal. No increase in right ventricular wall thickness. Right ventricular systolic function is  normal.  Left Atrium: Left atrial size was moderately dilated.  Right Atrium: Right atrial size was normal in size.  Pericardium: There is no evidence of pericardial effusion.  Mitral Valve: The mitral valve is normal in structure. No evidence of mitral valve regurgitation. No evidence of mitral valve stenosis.  Tricuspid Valve: The tricuspid valve is normal in structure. Tricuspid valve regurgitation is not demonstrated. No evidence of tricuspid stenosis.  Aortic Valve: The aortic valve is normal in structure. Aortic valve regurgitation is not visualized. Aortic valve sclerosis is present, with no evidence of aortic valve stenosis.  Pulmonic Valve: The pulmonic valve was normal in structure. Pulmonic valve regurgitation is not visualized. No evidence of pulmonic stenosis.  Aorta: The aortic root is normal in size and structure.  Venous: The inferior vena cava is normal in size with greater than 50% respiratory variability, suggesting right atrial pressure of 3 mmHg.  IAS/Shunts: No atrial level shunt  detected by color flow Doppler.   LEFT VENTRICLE PLAX 2D LVIDd:         4.50 cm   Diastology LVIDs:         3.00 cm   LV e' medial:    8.39 cm/s LV PW:         1.30 cm   LV E/e' medial:  9.0 LV IVS:        1.30 cm   LV e' lateral:   7.18 cm/s LVOT diam:     2.00 cm   LV E/e' lateral: 10.5 LV SV:         83 LV SV Index:   44 LVOT Area:     3.14 cm   RIGHT VENTRICLE             IVC RV Basal diam:  2.90 cm     IVC diam: 2.00 cm RV Mid diam:    2.70 cm RV S prime:     23.30 cm/s TAPSE (M-mode): 2.6 cm  LEFT ATRIUM             Index        RIGHT ATRIUM           Index LA diam:        4.30 cm 2.28 cm/m   RA Area:     13.30 cm LA Vol (A2C):   30.1 ml 15.95 ml/m  RA Volume:   27.90 ml  14.78 ml/m LA Vol (A4C):   62.0 ml 32.85 ml/m LA Biplane Vol: 47.6 ml 25.22 ml/m AORTIC VALVE LVOT Vmax:   119.50 cm/s LVOT Vmean:  78.100 cm/s LVOT VTI:    0.266 m  AORTA Ao Root diam: 3.40 cm Ao Asc diam:  3.50 cm  MITRAL VALVE MV Area (PHT): 2.95 cm     SHUNTS MV Decel Time: 258 msec     Systemic VTI:  0.27 m MV E velocity: 75.50 cm/s   Systemic Diam: 2.00 cm MV A velocity: 106.50 cm/s MV E/A ratio:  0.71  Belva Crome MD Electronically signed by Belva Crome MD Signature Date/Time: 02/23/2023/4:07:55 PM    Final              EKG:  EKG is  ordered today.  The ekg ordered today demonstrates normal sinus rhythm, with left axis deviation, heart rate 65 bpm, consistent with prior EKG tracings.  Recent Labs: 03/19/2023: BUN 16; Creatinine, Ser 1.20; Potassium 4.1; Sodium 138  Recent Lipid Panel No results found for: "CHOL", "TRIG", "HDL", "CHOLHDL", "VLDL", "LDLCALC", "  LDLDIRECT"   Risk Assessment/Calculations:      No BP recorded.  {Refresh Note OR Click here to enter BP  :1}***         Physical Exam:    VS:  There were no vitals taken for this visit.    Wt Readings from Last 3 Encounters:  01/29/23 194 lb (88 kg)  01/17/22 185 lb 9.6 oz (84.2 kg)  07/19/21 181  lb (82.1 kg)     GEN:  Well nourished, well developed in no acute distress HEENT: Normal NECK: No JVD; No carotid bruits LYMPHATICS: No lymphadenopathy CARDIAC: RRR, no murmurs, rubs, gallops RESPIRATORY:  Clear to auscultation without rales, wheezing or rhonchi  ABDOMEN: Soft, non-tender, non-distended MUSCULOSKELETAL: Trace pedal edema, no deformity  SKIN: Warm and dry NEUROLOGIC:  Alert and oriented x 3 PSYCHIATRIC:  Normal affect   ASSESSMENT:    No diagnosis found.  PLAN:    In order of problems listed above:  CAD-s/p non-STEMI in 2019, Stable with no anginal symptoms. No indication for ischemic evaluation.  Continue aspirin 81 mg daily, continue Lopressor 50 mg twice daily, continue Zocor 40 mg daily. Heart failure with improved ejection fraction-most recent echocardiogram in 2022 revealed an EF 60 to 65%, moderate concentric LVH, grade 1 DD, mild dilatation of the ascending order 37 mm.  NYHA class I, euvolemic.  Continue losartan 100 mg daily, continue Lopressor 50 mg twice daily, continue Lasix 40 mg every day, continue spironolactone 25 mg every day. Pedal edema-she feels like this is relatively new for her in spite of taking her Lasix and spironolactone.  Will repeat echocardiogram. Ascending aortic dilatation-mildly dilated at 37 mm in echo in 2022, will repeat echocardiogram for surveillance. Hypertension-blood pressure is well-controlled at 120/80, she keeps excellent records of her blood pressure readings at home, they are all well-controlled.  Continue clonidine 0.1 mg, she states she takes this every evening, Apresoline 75 mg 3 times daily, losartan 100 mg daily, metoprolol 50 mg twice daily. Hyperlipidemia-most recent LDL was well-controlled at 57, this is primarily managed by her PCP, continue Zocor 40 mg daily.  Disposition-repeat echocardiogram for aortic dilatation surveillance and pedal edema.  Return in 6 months.           Medication Adjustments/Labs and  Tests Ordered: Current medicines are reviewed at length with the patient today.  Concerns regarding medicines are outlined above.  No orders of the defined types were placed in this encounter.  No orders of the defined types were placed in this encounter.   There are no Patient Instructions on file for this visit.   Signed, Flossie Dibble, NP  07/29/2023 3:26 PM    [facility]

## 2023-07-30 ENCOUNTER — Encounter: Payer: Self-pay | Admitting: *Deleted

## 2023-07-30 ENCOUNTER — Ambulatory Visit: Payer: Medicare Other | Attending: Cardiology | Admitting: Cardiology

## 2023-07-30 ENCOUNTER — Encounter: Payer: Self-pay | Admitting: Cardiology

## 2023-07-30 VITALS — BP 113/69 | HR 62 | Ht 62.0 in | Wt 181.0 lb

## 2023-07-30 DIAGNOSIS — I10 Essential (primary) hypertension: Secondary | ICD-10-CM | POA: Diagnosis present

## 2023-07-30 DIAGNOSIS — I5181 Takotsubo syndrome: Secondary | ICD-10-CM | POA: Diagnosis present

## 2023-07-30 DIAGNOSIS — E782 Mixed hyperlipidemia: Secondary | ICD-10-CM | POA: Diagnosis present

## 2023-07-30 DIAGNOSIS — I5020 Unspecified systolic (congestive) heart failure: Secondary | ICD-10-CM | POA: Diagnosis present

## 2023-07-30 DIAGNOSIS — I2510 Atherosclerotic heart disease of native coronary artery without angina pectoris: Secondary | ICD-10-CM | POA: Diagnosis present

## 2023-07-30 DIAGNOSIS — R600 Localized edema: Secondary | ICD-10-CM

## 2023-07-30 NOTE — Patient Instructions (Signed)
Medication Instructions:  Your physician recommends that you continue on your current medications as directed. Please refer to the Current Medication list given to you today.  *If you need a refill on your cardiac medications before your next appointment, please call your pharmacy*   Lab Work: None If you have labs (blood work) drawn today and your tests are completely normal, you will receive your results only by: MyChart Message (if you have MyChart) OR A paper copy in the mail If you have any lab test that is abnormal or we need to change your treatment, we will call you to review the results.   Testing/Procedures: None   Follow-Up: At [facility], you and your health needs are our priority.  As part of our continuing mission to provide you with exceptional heart care, we have created designated Provider Care Teams.  These Care Teams include your primary Cardiologist (physician) and Advanced Practice Providers (APPs -  Physician Assistants and Nurse Practitioners) who all work together to provide you with the care you need, when you need it.  We recommend signing up for the patient portal called "MyChart".  Sign up information is provided on this After Visit Summary.  MyChart is used to connect with patients for Virtual Visits (Telemedicine).  Patients are able to view lab/test results, encounter notes, upcoming appointments, etc.  Non-urgent messages can be sent to your provider as well.   To learn more about what you can do with MyChart, go to [email or web address].    Your next appointment:   6 month(s)  Provider:   Robert Krasowski, MD    Other Instructions None  

## 2023-08-10 DIAGNOSIS — E1165 Type 2 diabetes mellitus with hyperglycemia: Secondary | ICD-10-CM | POA: Diagnosis not present

## 2023-08-10 DIAGNOSIS — I5181 Takotsubo syndrome: Secondary | ICD-10-CM | POA: Diagnosis not present

## 2023-08-10 DIAGNOSIS — N1831 Chronic kidney disease, stage 3a: Secondary | ICD-10-CM | POA: Diagnosis not present

## 2023-08-10 DIAGNOSIS — I10 Essential (primary) hypertension: Secondary | ICD-10-CM | POA: Diagnosis not present

## 2023-08-10 DIAGNOSIS — F5104 Psychophysiologic insomnia: Secondary | ICD-10-CM | POA: Diagnosis not present

## 2023-08-10 DIAGNOSIS — Z9181 History of falling: Secondary | ICD-10-CM | POA: Diagnosis not present

## 2023-08-10 DIAGNOSIS — Z6832 Body mass index (BMI) 32.0-32.9, adult: Secondary | ICD-10-CM | POA: Diagnosis not present

## 2023-08-10 DIAGNOSIS — F320 Major depressive disorder, single episode, mild: Secondary | ICD-10-CM | POA: Diagnosis not present

## 2023-08-10 DIAGNOSIS — E785 Hyperlipidemia, unspecified: Secondary | ICD-10-CM | POA: Diagnosis not present

## 2023-08-14 ENCOUNTER — Other Ambulatory Visit: Payer: Self-pay | Admitting: Cardiology

## 2023-09-06 DIAGNOSIS — B9689 Other specified bacterial agents as the cause of diseases classified elsewhere: Secondary | ICD-10-CM | POA: Diagnosis not present

## 2023-09-06 DIAGNOSIS — J019 Acute sinusitis, unspecified: Secondary | ICD-10-CM | POA: Diagnosis not present

## 2023-09-19 ENCOUNTER — Other Ambulatory Visit: Payer: Self-pay | Admitting: Cardiology

## 2023-09-19 NOTE — Telephone Encounter (Signed)
 Prescription sent to pharmacy.

## 2023-10-08 ENCOUNTER — Telehealth: Payer: Self-pay | Admitting: Cardiology

## 2023-10-08 MED ORDER — LOSARTAN POTASSIUM 100 MG PO TABS: 100.0000 mg | ORAL_TABLET | Freq: Every day | ORAL | 2 refills | Status: DC

## 2023-10-08 NOTE — Telephone Encounter (Signed)
*  STAT* If patient is at the pharmacy, call can be transferred to refill team.   1. Which medications need to be refilled? (please list name of each medication and dose if known)  need a new prescription for Losartan   2. Would you like to learn more about the convenience, safety, & potential cost savings by using the [facility]?     3. Are you open to using the [facility] (Type [facility].    4. Which pharmacy/location (including street and city if local pharmacy) is medication to be sent to? [facility] [address] [place]   5. Do they need a 30 day or 90 day supply? 90 days and refills

## 2023-10-08 NOTE — Telephone Encounter (Signed)
RX has been sent.

## 2023-10-26 DIAGNOSIS — J069 Acute upper respiratory infection, unspecified: Secondary | ICD-10-CM | POA: Diagnosis not present

## 2023-10-26 DIAGNOSIS — R0981 Nasal congestion: Secondary | ICD-10-CM | POA: Diagnosis not present

## 2023-11-08 DIAGNOSIS — E119 Type 2 diabetes mellitus without complications: Secondary | ICD-10-CM | POA: Diagnosis not present

## 2023-11-12 DIAGNOSIS — E785 Hyperlipidemia, unspecified: Secondary | ICD-10-CM | POA: Diagnosis not present

## 2023-11-12 DIAGNOSIS — E66811 Obesity, class 1: Secondary | ICD-10-CM | POA: Diagnosis not present

## 2023-11-12 DIAGNOSIS — N1831 Chronic kidney disease, stage 3a: Secondary | ICD-10-CM | POA: Diagnosis not present

## 2023-11-12 DIAGNOSIS — F5104 Psychophysiologic insomnia: Secondary | ICD-10-CM | POA: Diagnosis not present

## 2023-11-12 DIAGNOSIS — E1165 Type 2 diabetes mellitus with hyperglycemia: Secondary | ICD-10-CM | POA: Diagnosis not present

## 2023-11-12 DIAGNOSIS — M1990 Unspecified osteoarthritis, unspecified site: Secondary | ICD-10-CM | POA: Diagnosis not present

## 2023-11-12 DIAGNOSIS — F320 Major depressive disorder, single episode, mild: Secondary | ICD-10-CM | POA: Diagnosis not present

## 2023-11-12 DIAGNOSIS — Z6832 Body mass index (BMI) 32.0-32.9, adult: Secondary | ICD-10-CM | POA: Diagnosis not present

## 2023-11-12 DIAGNOSIS — I10 Essential (primary) hypertension: Secondary | ICD-10-CM | POA: Diagnosis not present

## 2023-11-26 DIAGNOSIS — Z4789 Encounter for other orthopedic aftercare: Secondary | ICD-10-CM | POA: Diagnosis not present

## 2023-11-26 DIAGNOSIS — M85541 Aneurysmal bone cyst, right hand: Secondary | ICD-10-CM | POA: Diagnosis not present

## 2023-11-26 DIAGNOSIS — M8550 Aneurysmal bone cyst, unspecified site: Secondary | ICD-10-CM | POA: Diagnosis not present

## 2023-11-26 HISTORY — DX: Encounter for other orthopedic aftercare: Z47.89

## 2023-12-20 ENCOUNTER — Other Ambulatory Visit: Payer: Self-pay | Admitting: Cardiology

## 2023-12-20 ENCOUNTER — Other Ambulatory Visit: Payer: Self-pay

## 2023-12-20 MED ORDER — HYDRALAZINE HCL 50 MG PO TABS: 75.0000 mg | ORAL_TABLET | Freq: Three times a day (TID) | ORAL | 1 refills | Status: DC

## 2024-01-29 ENCOUNTER — Encounter: Payer: Self-pay | Admitting: Cardiology

## 2024-01-30 ENCOUNTER — Encounter: Payer: Self-pay | Admitting: Cardiology

## 2024-01-30 ENCOUNTER — Ambulatory Visit: Attending: Cardiology | Admitting: Cardiology

## 2024-01-30 ENCOUNTER — Ambulatory Visit: Attending: Cardiology

## 2024-01-30 VITALS — BP 122/70 | HR 68 | Ht 62.0 in | Wt 181.8 lb

## 2024-01-30 DIAGNOSIS — R002 Palpitations: Secondary | ICD-10-CM

## 2024-01-30 DIAGNOSIS — I5181 Takotsubo syndrome: Secondary | ICD-10-CM | POA: Diagnosis not present

## 2024-01-30 DIAGNOSIS — I5020 Unspecified systolic (congestive) heart failure: Secondary | ICD-10-CM

## 2024-01-30 DIAGNOSIS — R42 Dizziness and giddiness: Secondary | ICD-10-CM | POA: Diagnosis not present

## 2024-01-30 DIAGNOSIS — E119 Type 2 diabetes mellitus without complications: Secondary | ICD-10-CM

## 2024-01-30 DIAGNOSIS — I2510 Atherosclerotic heart disease of native coronary artery without angina pectoris: Secondary | ICD-10-CM

## 2024-01-30 NOTE — Patient Instructions (Signed)
Medication Instructions:  Your physician recommends that you continue on your current medications as directed. Please refer to the Current Medication list given to you today.  *If you need a refill on your cardiac medications before your next appointment, please call your pharmacy*   Lab Work: None Ordered If you have labs (blood work) drawn today and your tests are completely normal, you will receive your results only by: [place] (if you have MyChart) OR A paper copy in the mail If you have any lab test that is abnormal or we need to change your treatment, we will call you to review the results.   Testing/Procedures:  WHY IS MY DOCTOR PRESCRIBING ZIO? The Zio system is proven and trusted by physicians to detect and diagnose irregular heart rhythms -- and has been prescribed to hundreds of thousands of patients.  The FDA has cleared the Zio system to monitor for many different kinds of irregular heart rhythms. In a study, physicians were able to reach a diagnosis 90% of the time with the Zio system1.  You can wear the Zio monitor -- a small, discreet, comfortable patch -- during your normal day-to-day activity, including while you sleep, shower, and exercise, while it records every single heartbeat for analysis.  1Barrett, P., et al. Comparison of 24 Hour Holter Monitoring Versus 14 Day Novel Adhesive Patch Electrocardiographic Monitoring. [place], 2014.  ZIO VS. HOLTER MONITORING The Zio monitor can be comfortably worn for up to 14 days. Holter monitors can be worn for 24 to 48 hours, limiting the time to record any irregular heart rhythms you may have. Zio is able to capture data for the 51% of patients who have their first symptom-triggered arrhythmia after 48 hours.1  LIVE WITHOUT RESTRICTIONS The Zio ambulatory cardiac monitor is a small, unobtrusive, and water-resistant patch--you might even forget you're wearing it. The Zio monitor records and stores  every beat of your heart, whether you're sleeping, working out, or showering.     Follow-Up: At [facility], you and your health needs are our priority.  As part of our continuing mission to provide you with exceptional heart care, we have created designated Provider Care Teams.  These Care Teams include your primary Cardiologist (physician) and Advanced Practice Providers (APPs -  Physician Assistants and Nurse Practitioners) who all work together to provide you with the care you need, when you need it.  We recommend signing up for the patient portal called "MyChart".  Sign up information is provided on this After Visit Summary.  MyChart is used to connect with patients for Virtual Visits (Telemedicine).  Patients are able to view lab/test results, encounter notes, upcoming appointments, etc.  Non-urgent messages can be sent to your provider as well.   To learn more about what you can do with MyChart, go to [email or web address].    Your next appointment:   12 month(s)  The format for your next appointment:   In Person  Provider:   Jenne Campus, MD    Other Instructions NA

## 2024-01-30 NOTE — Progress Notes (Signed)
 Cardiology Office Note:    Date:  01/30/2024   ID:  Hayley Simon, DOB 05-19-1946, MRN [id number]  PCP:  Olga Berthold, NP  Cardiologist:  Ralene Burger, MD    Referring MD: Olga Berthold, NP   No chief complaint on file.   History of Present Illness:    Hayley Simon is a 78 y.o. female past medical history significant for coronary artery disease, non-STEMI in 2019, mild nonobstructive coronary artery disease, she did have Takotsubo with complete recovery, also essential hypertension, PVD, anxiety, dyslipidemia comes today 2 months for follow-up doing well but she complained of having some dizzy spells those dizzy spells abrupt with no warning she may feel suddenly dizziness that lasted for couple seconds goes away she never fell down because of this however she felt like sometimes she would be end up on the floor denies have any chest pain tightness squeezing pressure burning chest  Past Medical History:  Diagnosis Date   Anxiety and depression    Atypical chest pain 07/02/2020   Carpal tunnel syndrome of right wrist 06/03/2013   Coronary artery disease    Coronary artery disease involving native coronary artery of native heart without angina pectoris 03/19/2017   Cardiac catheterization - 02/2017 Diagnostic Procedure Summary 1. Mild non-obstructive CAD 2. Anterior/lateral/inferior wall hypokinesis, LVEF 35%. ? 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   ascorbic acid (VITAMIN C) 1000 MG tablet Take 1,000 mg by mouth daily.   aspirin 81 MG tablet Take 81 mg by mouth daily.    Calcium Carb-Ergocalciferol 250-125 MG-UNIT TABS Take 1 tablet by mouth 2 (two) times daily.   cloNIDine  (CATAPRES ) 0.1 MG tablet Take 1 tablet (0.1 mg total) by mouth as needed (for systolic blood pressure over 100).   clotrimazole-betamethasone (LOTRISONE) cream Apply 1 application topically daily.   FLUoxetine (PROZAC) 20 MG capsule Take 1 capsule by mouth daily.   glimepiride (AMARYL) 4 MG tablet Take 4 mg by mouth daily with breakfast. Take 1/2 tablet daily.   hydrALAZINE  (APRESOLINE ) 50 MG tablet Take 1.5 tablets (75 mg total) by mouth 3 (three) times daily.   ibuprofen (ADVIL) 800 MG tablet Take 800 mg by mouth 2 (two) times daily as needed for mild pain or moderate pain (Arthritis).   loratadine (CLARITIN) 10 MG tablet Take 1 tablet by mouth as needed for allergies.   losartan  (COZAAR ) 100 MG tablet Take 1 tablet (100 mg total) by mouth daily.   metFORMIN (GLUCOPHAGE) 500 MG tablet Take 500 mg by mouth daily.   metoprolol tartrate (LOPRESSOR) 50 MG  tablet Take 1 tablet by mouth 2 (two) times daily.   minoxidil  (LONITEN ) 10 MG tablet Take 1 tablet (10 mg total) by mouth daily.   Multiple Vitamin (MULTIVITAMIN) capsule Take 1 capsule by mouth daily. Unknown strength   simvastatin  (ZOCOR ) 40 MG tablet TAKE 1 TABLET BY MOUTH EVERY DAY   spironolactone  (ALDACTONE ) 25 MG tablet Take 1 tablet (25 mg total) by mouth daily.   torsemide  (DEMADEX ) 20 MG tablet Take 1 tablet (20 mg total) by mouth daily.     Allergies:   Oxycodone-acetaminophen and  Penicillins   Social History   Socioeconomic History   Marital status: Widowed    Spouse name: Not on file   Number of children: Not on file   Years of education: Not on file   Highest education level: Not on file  Occupational History   Not on file  Tobacco Use   Smoking status: Never   Smokeless tobacco: Never  Vaping Use   Vaping status: Never Used  Substance and Sexual Activity   Alcohol use: Not Currently   Drug use: Never   Sexual activity: Not on file  Other Topics Concern   Not on file  Social History Narrative   Not on file   Social Drivers of Health   Financial Resource Strain: Not on file  Food Insecurity: Not on file  Transportation Needs: Not on file  Physical Activity: Not on file  Stress: Not on file  Social Connections: Not on file     Family History: The patient's family history includes Healthy in her father and mother. ROS:   Please see the history of present illness.    All 14 point review of systems negative except as described per history of present illness  EKGs/Labs/Other Studies Reviewed:    EKG Interpretation Date/Time:  Wednesday Jan 30 2024 16:15:03 EDT Ventricular Rate:  68 PR Interval:  184 QRS Duration:  78 QT Interval:  396 QTC Calculation: 421 R Axis:   -59  Text Interpretation: Normal sinus rhythm with sinus arrhythmia Low voltage QRS Left anterior fascicular block Possible Anterolateral infarct (cited on or before 03-Jun-2018) When compared with ECG of 03-Jun-2018 15:46, T wave amplitude has decreased in Lateral leads Confirmed by Ralene Burger [phone]) on 01/30/2024 4:37:51 PM    Recent Labs: 03/19/2023: BUN 16; Creatinine, Ser 1.20; Potassium 4.1; Sodium 138  Recent Lipid Panel No results found for: "CHOL", "TRIG", "HDL", "CHOLHDL", "VLDL", "LDLCALC", "LDLDIRECT"  Physical Exam:    VS:  BP 122/70   Pulse 68   Ht 5\' 2"  (1.575 m)   Wt 181 lb 12.8 oz (82.5 kg)   SpO2 92%   BMI 33.25 kg/m     Wt Readings from Last  3 Encounters:  01/30/24 181 lb 12.8 oz (82.5 kg)  07/30/23 181 lb (82.1 kg)  01/29/23 194 lb (88 kg)     GEN:  Well nourished, well developed in no acute distress HEENT: Normal NECK: No JVD; No carotid bruits LYMPHATICS: No lymphadenopathy CARDIAC: RRR, no murmurs, no rubs, no gallops RESPIRATORY:  Clear to auscultation without rales, wheezing or rhonchi  ABDOMEN: Soft, non-tender, non-distended MUSCULOSKELETAL:  No edema; No deformity  SKIN: Warm and dry LOWER EXTREMITIES: no swelling NEUROLOGIC:  Alert and oriented x 3 PSYCHIATRIC:  Normal affect   ASSESSMENT:    1. HFrEF (heart failure with reduced ejection fraction) (HCC)   2. Palpitations   3. Coronary artery disease involving native coronary artery of native heart without angina pectoris  4. Takotsubo syndrome   5. Diabetes mellitus without complication (HCC)   6. Dizziness    PLAN:    In order of problems listed above:  Dizziness.  Will schedule her to have Zio patch to make sure she does not have any inducible arrhythmia. Congestive heart failure compensated on appropriate guideline directed medical therapy. Essential hypertension blood pressure well-controlled continue present management. History of Takotsubo multiple echocardiogram done since that time showed preserved ejection fraction   Medication Adjustments/Labs and Tests Ordered: Current medicines are reviewed at length with the patient today.  Concerns regarding medicines are outlined above.  Orders Placed This Encounter  Procedures   LONG TERM MONITOR (3-14 DAYS)   EKG 12-Lead   Medication changes: No orders of the defined types were placed in this encounter.   Signed, Manfred Seed, MD, [facility] 01/30/2024 5:06 PM    [facility]

## 2024-02-03 DIAGNOSIS — H9202 Otalgia, left ear: Secondary | ICD-10-CM | POA: Diagnosis not present

## 2024-02-03 DIAGNOSIS — R070 Pain in throat: Secondary | ICD-10-CM | POA: Diagnosis not present

## 2024-02-03 DIAGNOSIS — R0981 Nasal congestion: Secondary | ICD-10-CM | POA: Diagnosis not present

## 2024-02-03 DIAGNOSIS — R051 Acute cough: Secondary | ICD-10-CM | POA: Diagnosis not present

## 2024-02-06 ENCOUNTER — Other Ambulatory Visit: Payer: Self-pay | Admitting: Cardiology

## 2024-02-12 ENCOUNTER — Telehealth: Payer: Self-pay | Admitting: Cardiology

## 2024-02-12 DIAGNOSIS — F5104 Psychophysiologic insomnia: Secondary | ICD-10-CM | POA: Diagnosis not present

## 2024-02-12 DIAGNOSIS — E66811 Obesity, class 1: Secondary | ICD-10-CM | POA: Diagnosis not present

## 2024-02-12 DIAGNOSIS — I10 Essential (primary) hypertension: Secondary | ICD-10-CM | POA: Diagnosis not present

## 2024-02-12 DIAGNOSIS — E785 Hyperlipidemia, unspecified: Secondary | ICD-10-CM | POA: Diagnosis not present

## 2024-02-12 DIAGNOSIS — E6609 Other obesity due to excess calories: Secondary | ICD-10-CM | POA: Diagnosis not present

## 2024-02-12 DIAGNOSIS — R42 Dizziness and giddiness: Secondary | ICD-10-CM | POA: Diagnosis not present

## 2024-02-12 DIAGNOSIS — E1165 Type 2 diabetes mellitus with hyperglycemia: Secondary | ICD-10-CM | POA: Diagnosis not present

## 2024-02-12 DIAGNOSIS — M1990 Unspecified osteoarthritis, unspecified site: Secondary | ICD-10-CM | POA: Diagnosis not present

## 2024-02-12 DIAGNOSIS — F320 Major depressive disorder, single episode, mild: Secondary | ICD-10-CM | POA: Diagnosis not present

## 2024-02-12 DIAGNOSIS — N1831 Chronic kidney disease, stage 3a: Secondary | ICD-10-CM | POA: Diagnosis not present

## 2024-02-12 DIAGNOSIS — Z6832 Body mass index (BMI) 32.0-32.9, adult: Secondary | ICD-10-CM | POA: Diagnosis not present

## 2024-02-12 MED ORDER — TORSEMIDE 20 MG PO TABS: 20.0000 mg | ORAL_TABLET | Freq: Every day | ORAL | 3 refills | Status: AC

## 2024-02-12 NOTE — Telephone Encounter (Signed)
 Pt's medication was sent to pt's pharmacy as requested. Confirmation received.

## 2024-02-12 NOTE — Telephone Encounter (Signed)
*  STAT* If patient is at the pharmacy, call can be transferred to refill team.   1. Which medications need to be refilled? (please list name of each medication and dose if known)   torsemide  (DEMADEX ) 20 MG tablet (Expired)   2. Would you like to learn more about the convenience, safety, & potential cost savings by using the [facility]?   3. Are you open to using the [facility] (Type [facility]. ).  4. Which pharmacy/location (including street and city if local pharmacy) is medication to be sent to?  [id number] - [place], [place] - [address]   5. Do they need a 30 day or 90 day supply?   90 day  Patient stated she still has some medication.

## 2024-02-26 DIAGNOSIS — R002 Palpitations: Secondary | ICD-10-CM | POA: Diagnosis not present

## 2024-03-10 DIAGNOSIS — R002 Palpitations: Secondary | ICD-10-CM | POA: Diagnosis not present

## 2024-03-13 ENCOUNTER — Ambulatory Visit: Payer: Self-pay | Admitting: Cardiology

## 2024-04-02 ENCOUNTER — Telehealth: Payer: Self-pay

## 2024-04-02 DIAGNOSIS — Z23 Encounter for immunization: Secondary | ICD-10-CM | POA: Diagnosis not present

## 2024-04-02 NOTE — Telephone Encounter (Signed)
 Pt viewed monitor results on My Chart per Dr. Vanetta Shawl note. Routed to PCP.

## 2024-04-07 ENCOUNTER — Telehealth: Payer: Self-pay | Admitting: Cardiology

## 2024-04-07 MED ORDER — CLONIDINE HCL 0.1 MG PO TABS: 0.1000 mg | ORAL_TABLET | ORAL | 2 refills | Status: AC | PRN

## 2024-04-07 NOTE — Telephone Encounter (Signed)
*  STAT* If patient is at the pharmacy, call can be transferred to refill team.   1. Which medications need to be refilled? (please list name of each medication and dose if known)  cloNIDine  (CATAPRES ) 0.1 MG tablet 2. Which pharmacy/location (including street and city if local pharmacy) is medication to be sent to? [id number] - [place], [place] - [address]    3. Do they need a 30 day or 90 day supply? 90 day supply  Patient says she has been out of medication for 1 week.

## 2024-05-05 ENCOUNTER — Other Ambulatory Visit: Payer: Self-pay | Admitting: Cardiology

## 2024-05-22 DIAGNOSIS — Z6832 Body mass index (BMI) 32.0-32.9, adult: Secondary | ICD-10-CM | POA: Diagnosis not present

## 2024-05-22 DIAGNOSIS — E1165 Type 2 diabetes mellitus with hyperglycemia: Secondary | ICD-10-CM | POA: Diagnosis not present

## 2024-05-22 DIAGNOSIS — M1990 Unspecified osteoarthritis, unspecified site: Secondary | ICD-10-CM | POA: Diagnosis not present

## 2024-05-22 DIAGNOSIS — R42 Dizziness and giddiness: Secondary | ICD-10-CM | POA: Diagnosis not present

## 2024-05-22 DIAGNOSIS — I10 Essential (primary) hypertension: Secondary | ICD-10-CM | POA: Diagnosis not present

## 2024-05-22 DIAGNOSIS — R5381 Other malaise: Secondary | ICD-10-CM | POA: Diagnosis not present

## 2024-05-22 DIAGNOSIS — F320 Major depressive disorder, single episode, mild: Secondary | ICD-10-CM | POA: Diagnosis not present

## 2024-05-22 DIAGNOSIS — N1831 Chronic kidney disease, stage 3a: Secondary | ICD-10-CM | POA: Diagnosis not present

## 2024-05-22 DIAGNOSIS — F5104 Psychophysiologic insomnia: Secondary | ICD-10-CM | POA: Diagnosis not present

## 2024-05-22 DIAGNOSIS — E785 Hyperlipidemia, unspecified: Secondary | ICD-10-CM | POA: Diagnosis not present

## 2024-06-14 ENCOUNTER — Other Ambulatory Visit: Payer: Self-pay | Admitting: Cardiology

## 2024-06-27 ENCOUNTER — Other Ambulatory Visit: Payer: Self-pay | Admitting: Cardiology

## 2024-07-04 DIAGNOSIS — Z1231 Encounter for screening mammogram for malignant neoplasm of breast: Secondary | ICD-10-CM | POA: Diagnosis not present

## 2024-07-31 ENCOUNTER — Other Ambulatory Visit: Payer: Self-pay | Admitting: Cardiology

## 2024-08-11 ENCOUNTER — Telehealth: Payer: Self-pay | Admitting: Cardiology

## 2024-08-11 NOTE — Telephone Encounter (Signed)
*  STAT* If patient is at the pharmacy, call can be transferred to refill team.   1. Which medications need to be refilled? (please list name of each medication and dose if known)   hydrALAZINE  (APRESOLINE ) 50 MG tablet     4. Which pharmacy/location (including street and city if local pharmacy) is medication to be sent to?  [id number] - [place], [place] - [address]     5. Do they need a 30 day or 90 day supply? 90

## 2024-08-12 MED ORDER — HYDRALAZINE HCL 50 MG PO TABS: 75.0000 mg | ORAL_TABLET | Freq: Three times a day (TID) | ORAL | 1 refills | Status: AC

## 2024-08-12 NOTE — Telephone Encounter (Signed)
 Refilled as requested
